# Patient Record
Sex: Female | Born: 1965
Health system: Southern US, Community
[De-identification: ages and names within clinical notes are randomized; demographics above are authoritative.]

## PROBLEM LIST (undated history)

## (undated) DIAGNOSIS — I10 Essential (primary) hypertension: Secondary | ICD-10-CM

## (undated) HISTORY — DX: Essential (primary) hypertension: I10

---

## 2001-04-07 ENCOUNTER — Inpatient Hospital Stay (HOSPITAL_COMMUNITY): Admission: AD | Admit: 2001-04-07 | Discharge: 2001-04-07 | Payer: Self-pay | Admitting: *Deleted

## 2001-05-11 ENCOUNTER — Encounter: Admission: RE | Admit: 2001-05-11 | Discharge: 2001-05-11 | Payer: Self-pay | Admitting: *Deleted

## 2001-05-23 ENCOUNTER — Ambulatory Visit (HOSPITAL_COMMUNITY): Admission: RE | Admit: 2001-05-23 | Discharge: 2001-05-23 | Payer: Self-pay | Admitting: *Deleted

## 2001-05-31 ENCOUNTER — Encounter: Admission: RE | Admit: 2001-05-31 | Discharge: 2001-05-31 | Payer: Self-pay | Admitting: *Deleted

## 2001-06-21 ENCOUNTER — Encounter: Admission: RE | Admit: 2001-06-21 | Discharge: 2001-06-21 | Payer: Self-pay | Admitting: *Deleted

## 2001-06-26 ENCOUNTER — Inpatient Hospital Stay (HOSPITAL_COMMUNITY): Admission: AD | Admit: 2001-06-26 | Discharge: 2001-06-26 | Payer: Self-pay | Admitting: *Deleted

## 2001-07-12 ENCOUNTER — Encounter: Admission: RE | Admit: 2001-07-12 | Discharge: 2001-07-12 | Payer: Self-pay | Admitting: *Deleted

## 2001-08-02 ENCOUNTER — Encounter: Admission: RE | Admit: 2001-08-02 | Discharge: 2001-08-02 | Payer: Self-pay | Admitting: *Deleted

## 2001-08-16 ENCOUNTER — Encounter: Admission: RE | Admit: 2001-08-16 | Discharge: 2001-08-16 | Payer: Self-pay | Admitting: *Deleted

## 2001-08-28 ENCOUNTER — Ambulatory Visit (HOSPITAL_COMMUNITY): Admission: RE | Admit: 2001-08-28 | Discharge: 2001-08-28 | Payer: Self-pay | Admitting: *Deleted

## 2001-08-30 ENCOUNTER — Encounter: Admission: RE | Admit: 2001-08-30 | Discharge: 2001-08-30 | Payer: Self-pay | Admitting: *Deleted

## 2001-09-13 ENCOUNTER — Encounter: Admission: RE | Admit: 2001-09-13 | Discharge: 2001-09-13 | Payer: Self-pay | Admitting: *Deleted

## 2001-09-20 ENCOUNTER — Encounter: Admission: RE | Admit: 2001-09-20 | Discharge: 2001-09-20 | Payer: Self-pay | Admitting: *Deleted

## 2001-09-22 ENCOUNTER — Ambulatory Visit (HOSPITAL_COMMUNITY): Admission: RE | Admit: 2001-09-22 | Discharge: 2001-09-22 | Payer: Self-pay | Admitting: *Deleted

## 2001-09-27 ENCOUNTER — Encounter: Admission: RE | Admit: 2001-09-27 | Discharge: 2001-09-27 | Payer: Self-pay | Admitting: *Deleted

## 2001-09-27 ENCOUNTER — Encounter (HOSPITAL_COMMUNITY): Admission: RE | Admit: 2001-09-27 | Discharge: 2001-10-18 | Payer: Self-pay | Admitting: *Deleted

## 2001-10-04 ENCOUNTER — Encounter: Admission: RE | Admit: 2001-10-04 | Discharge: 2001-10-04 | Payer: Self-pay | Admitting: *Deleted

## 2001-10-11 ENCOUNTER — Encounter: Admission: RE | Admit: 2001-10-11 | Discharge: 2001-10-11 | Payer: Self-pay | Admitting: *Deleted

## 2001-10-18 ENCOUNTER — Encounter: Admission: RE | Admit: 2001-10-18 | Discharge: 2001-10-18 | Payer: Self-pay | Admitting: *Deleted

## 2001-10-19 ENCOUNTER — Inpatient Hospital Stay (HOSPITAL_COMMUNITY): Admission: AD | Admit: 2001-10-19 | Discharge: 2001-10-22 | Payer: Self-pay | Admitting: *Deleted

## 2001-10-20 ENCOUNTER — Encounter (INDEPENDENT_AMBULATORY_CARE_PROVIDER_SITE_OTHER): Payer: Self-pay | Admitting: Specialist

## 2002-05-30 ENCOUNTER — Encounter: Payer: Self-pay | Admitting: Obstetrics & Gynecology

## 2002-05-30 ENCOUNTER — Ambulatory Visit (HOSPITAL_COMMUNITY): Admission: RE | Admit: 2002-05-30 | Discharge: 2002-05-30 | Payer: Self-pay | Admitting: Obstetrics & Gynecology

## 2002-06-22 ENCOUNTER — Encounter: Payer: Self-pay | Admitting: *Deleted

## 2002-06-22 ENCOUNTER — Ambulatory Visit (HOSPITAL_COMMUNITY): Admission: RE | Admit: 2002-06-22 | Discharge: 2002-06-22 | Payer: Self-pay | Admitting: *Deleted

## 2002-11-05 ENCOUNTER — Inpatient Hospital Stay (HOSPITAL_COMMUNITY): Admission: AD | Admit: 2002-11-05 | Discharge: 2002-11-08 | Payer: Self-pay | Admitting: *Deleted

## 2003-06-23 ENCOUNTER — Emergency Department (HOSPITAL_COMMUNITY): Admission: EM | Admit: 2003-06-23 | Discharge: 2003-06-23 | Payer: Self-pay | Admitting: Family Medicine

## 2003-09-20 ENCOUNTER — Ambulatory Visit: Payer: Self-pay | Admitting: Family Medicine

## 2003-12-10 ENCOUNTER — Ambulatory Visit: Payer: Self-pay | Admitting: Family Medicine

## 2003-12-10 DIAGNOSIS — F329 Major depressive disorder, single episode, unspecified: Secondary | ICD-10-CM

## 2004-04-23 ENCOUNTER — Inpatient Hospital Stay (HOSPITAL_COMMUNITY): Admission: AD | Admit: 2004-04-23 | Discharge: 2004-04-23 | Payer: Self-pay | Admitting: *Deleted

## 2004-06-15 ENCOUNTER — Ambulatory Visit (HOSPITAL_COMMUNITY): Admission: RE | Admit: 2004-06-15 | Discharge: 2004-06-15 | Payer: Self-pay | Admitting: *Deleted

## 2004-10-22 ENCOUNTER — Ambulatory Visit: Payer: Self-pay | Admitting: Obstetrics and Gynecology

## 2004-10-22 ENCOUNTER — Inpatient Hospital Stay (HOSPITAL_COMMUNITY): Admission: AD | Admit: 2004-10-22 | Discharge: 2004-10-24 | Payer: Self-pay | Admitting: *Deleted

## 2006-01-22 ENCOUNTER — Emergency Department (HOSPITAL_COMMUNITY): Admission: EM | Admit: 2006-01-22 | Discharge: 2006-01-22 | Payer: Self-pay | Admitting: Emergency Medicine

## 2006-11-18 ENCOUNTER — Telehealth (INDEPENDENT_AMBULATORY_CARE_PROVIDER_SITE_OTHER): Payer: Self-pay | Admitting: *Deleted

## 2006-12-08 ENCOUNTER — Encounter (INDEPENDENT_AMBULATORY_CARE_PROVIDER_SITE_OTHER): Payer: Self-pay | Admitting: *Deleted

## 2007-12-11 ENCOUNTER — Ambulatory Visit: Payer: Self-pay | Admitting: Family Medicine

## 2007-12-11 DIAGNOSIS — G44209 Tension-type headache, unspecified, not intractable: Secondary | ICD-10-CM

## 2008-01-05 DIAGNOSIS — I10 Essential (primary) hypertension: Secondary | ICD-10-CM

## 2008-01-05 HISTORY — DX: Essential (primary) hypertension: I10

## 2008-01-23 ENCOUNTER — Encounter (INDEPENDENT_AMBULATORY_CARE_PROVIDER_SITE_OTHER): Payer: Self-pay | Admitting: Family Medicine

## 2008-01-23 ENCOUNTER — Ambulatory Visit: Payer: Self-pay | Admitting: Family Medicine

## 2008-01-23 LAB — CONVERTED CEMR LAB
Bilirubin Urine: NEGATIVE
Blood in Urine, dipstick: NEGATIVE

## 2008-01-24 ENCOUNTER — Encounter (INDEPENDENT_AMBULATORY_CARE_PROVIDER_SITE_OTHER): Payer: Self-pay | Admitting: Family Medicine

## 2008-01-24 LAB — CONVERTED CEMR LAB
ALT: 15 units/L (ref 0–35)
Alkaline Phosphatase: 61 units/L (ref 39–117)
Basophils Relative: 0 % (ref 0–1)
CO2: 22 meq/L (ref 19–32)
Chlamydia, DNA Probe: NEGATIVE
Creatinine, Ser: 0.94 mg/dL (ref 0.40–1.20)
GC Probe Amp, Genital: NEGATIVE
Glucose, Bld: 79 mg/dL (ref 70–99)
HCT: 41.7 % (ref 36.0–46.0)
Hemoglobin: 14.5 g/dL (ref 12.0–15.0)
Lymphocytes Relative: 44 % (ref 12–46)
Lymphs Abs: 2.8 10*3/uL (ref 0.7–4.0)
MCV: 84.9 fL (ref 78.0–100.0)
Monocytes Absolute: 0.4 10*3/uL (ref 0.1–1.0)
Monocytes Relative: 6 % (ref 3–12)
Potassium: 3.7 meq/L (ref 3.5–5.3)
Sodium: 140 meq/L (ref 135–145)
Total Protein: 8.1 g/dL (ref 6.0–8.3)
WBC: 6.4 10*3/uL (ref 4.0–10.5)

## 2008-01-25 ENCOUNTER — Encounter (INDEPENDENT_AMBULATORY_CARE_PROVIDER_SITE_OTHER): Payer: Self-pay | Admitting: Family Medicine

## 2008-01-25 ENCOUNTER — Ambulatory Visit (HOSPITAL_COMMUNITY): Admission: RE | Admit: 2008-01-25 | Discharge: 2008-01-25 | Payer: Self-pay | Admitting: Family Medicine

## 2008-01-30 ENCOUNTER — Encounter: Admission: RE | Admit: 2008-01-30 | Discharge: 2008-01-30 | Payer: Self-pay | Admitting: Family Medicine

## 2008-02-01 ENCOUNTER — Encounter (INDEPENDENT_AMBULATORY_CARE_PROVIDER_SITE_OTHER): Payer: Self-pay | Admitting: Family Medicine

## 2008-02-27 ENCOUNTER — Encounter (INDEPENDENT_AMBULATORY_CARE_PROVIDER_SITE_OTHER): Payer: Self-pay | Admitting: Family Medicine

## 2008-12-18 ENCOUNTER — Ambulatory Visit: Payer: Self-pay | Admitting: Family Medicine

## 2008-12-18 DIAGNOSIS — R519 Headache, unspecified: Secondary | ICD-10-CM | POA: Insufficient documentation

## 2008-12-18 DIAGNOSIS — J0100 Acute maxillary sinusitis, unspecified: Secondary | ICD-10-CM

## 2008-12-18 DIAGNOSIS — R51 Headache: Secondary | ICD-10-CM

## 2009-03-03 ENCOUNTER — Ambulatory Visit: Payer: Self-pay | Admitting: Nurse Practitioner

## 2009-03-03 DIAGNOSIS — K047 Periapical abscess without sinus: Secondary | ICD-10-CM

## 2009-03-05 ENCOUNTER — Encounter (INDEPENDENT_AMBULATORY_CARE_PROVIDER_SITE_OTHER): Payer: Self-pay | Admitting: Nurse Practitioner

## 2009-09-09 ENCOUNTER — Telehealth (INDEPENDENT_AMBULATORY_CARE_PROVIDER_SITE_OTHER): Payer: Self-pay | Admitting: Nurse Practitioner

## 2009-09-16 ENCOUNTER — Encounter (INDEPENDENT_AMBULATORY_CARE_PROVIDER_SITE_OTHER): Payer: Self-pay | Admitting: Nurse Practitioner

## 2010-02-03 NOTE — Letter (Signed)
Summary: DENTAL REFERRAL  DENTAL REFERRAL   Imported By: Arta Bruce 05/01/2009 10:52:08  _____________________________________________________________________  External Attachment:    Type:   Image     Comment:   External Document

## 2010-02-03 NOTE — Progress Notes (Signed)
Summary: COLD AND COUGH  Phone Note Call from Patient Call back at Home Phone [phone]   Summary of Call: Cheryl Browning old patient. MS Labell CALLED AND SAYS THAT SHE HAS A COLD AND COUGH AND WANTS TO BE SEEN. I OFFEREED HER AN APPOINTMENT WITH DR MULBERRY THIS FRIDAY MORNING AND SHE YELLED AND SAID BY THEN SHE WILL BE BETTER. I TOLD HER TERE WASNT ANYTHING SOONER. Initial call taken by: Leodis Rains,  September 09, 2009 10:43 AM  Follow-up for Phone Call        Called pt. and spoke to husband -- pt. is sleeping at present, wants an appt.  Left message for pt. to call back when awake.  Dutch Quint RN  September 09, 2009 11:57 AM   Additional Follow-up for Phone Call Additional follow up Details #1::        schedule pt an appt with whomever has an opening this week Additional Follow-up by: Lehman Prom FNP,  September 09, 2009 12:48 PM    Additional Follow-up for Phone Call Additional follow up Details #2::    Left message on answering machine for pt. to return call.  Dutch Quint RN  September 09, 2009 2:52 PM  Left message on answering machine for pt. to return call.  Dutch Quint RN  September 10, 2009 12:16 PM  Left message on answering machine for pt. to return call.  Dutch Quint RN  September 15, 2009 10:27 AM  Letter sent.  Dutch Quint RN  September 16, 2009 5:02 PM

## 2010-02-03 NOTE — Letter (Signed)
Summary: Generic Letter  [facility]  [address] [place], [place] [address]   Phone: [phone]  Fax: [phone]       09/16/2009  Cheryl Browning [address] [place], [place]  [address]  Dear [name],  We have been unable to contact you by telephone regarding your recent phone call to our office.  Please call our office, at your earliest convenience, so that we may speak with you.   Sincerely,   Dutch Quint RN

## 2010-02-03 NOTE — Assessment & Plan Note (Signed)
Summary: Dental problems   Vital Signs:  Patient profile:   45 year old female Menstrual status:  regular Height:      62 inches Weight:      151.5 pounds BMI:     27.81 Temp:     97.9 degrees F Pulse rate:   74 / minute Pulse rhythm:   regular Resp:     20 per minute BP sitting:   119 / 78  (left arm) Cuff size:   regular CC: Toothache Is Patient Diabetic? No Pain Assessment Patient in pain? yes     Location: tooth Intensity: 10 Type: sharp Onset of pain  2 days  Does patient need assistance? Functional Status Self care Ambulation Normal Comments pt has mirena iud, lst period 4 yrs ago LMP - Character: IUD Menarche (age onset years): 12-13    Menstrual Status regular Last PAP Result NEGATIVE FOR INTRAEPITHELIAL LESIONS OR MALIGNANCY.   Primary Care Provider:  Beverley Fiedler MD  CC:  Toothache.  History of Present Illness:  Pt into the office with complaints of left upper tooth pain intermittent pain in tooth previously but current pain started 3 days ago and continues. Some swelling of jaw Decreased appetite due to discomfort sensitivity to hot and cold foods +headache -fever (that was measured) but did have some chills Pt has not taken anything for pain. Previous Rx for Naprosyn but did not get filed  Pt has never been to the dentist.  No previous dental extractions. Wisdom teeth are all in place.  Allergies (verified): 1)  ! * Chloquen  Review of Systems General:  Complains of chills and loss of appetite; denies fever; mouth pain - upper tooth pain. CV:  Denies chest pain or discomfort. Resp:  Denies cough. GI:  Denies abdominal pain, nausea, and vomiting.  Physical Exam  General:  alert.  sickly  Head:  normocephalic.   left jaw swelling Mouth:  wisdom teeth in place left upper tooth - appears to be impacted by wisdom tooth general discoloration to all teeth   Impression & Recommendations:  Problem # 1:  ABSCESS, TOOTH  (ICD-522.5) impacted left upper wisdom tooth with abcess pt advised of Dx  antibiotics ordered Orders: Dental Referral (Dentist)  Complete Medication List: 1)  Naprosyn 500 Mg Tabs (Naproxen) .... One tablet by mouth two times a day for pain as needed 2)  Flonase 50 Mcg/act Susp (Fluticasone propionate) .... 2 puffs per nostril daily 3)  Penicillin V Potassium 500 Mg Tabs (Penicillin v potassium) .... One tablet by mouth three times a day for infection  Patient Instructions: 1)  You will be referred to the dental clinic. they will contact you with a time/date of the appointment 2)  Take all the antibiotics as ordered 3)  follow up here as needed Prescriptions: NAPROSYN 500 MG TABS (NAPROXEN) One tablet by mouth two times a day for pain as needed  #60 x 0   Entered and Authorized by:   Lehman Prom FNP   Signed by:   Lehman Prom FNP on 03/03/2009   Method used:   Print then Give to Patient   RxID:   [id number] PENICILLIN V POTASSIUM 500 MG TABS (PENICILLIN V POTASSIUM) One tablet by mouth three times a day for infection  #30 x 0   Entered and Authorized by:   Lehman Prom FNP   Signed by:   Lehman Prom FNP on 03/03/2009   Method used:   Print then Give to Patient   RxID:  [id number]  

## 2010-07-01 ENCOUNTER — Emergency Department (HOSPITAL_COMMUNITY): Payer: No Typology Code available for payment source

## 2010-07-01 ENCOUNTER — Emergency Department (HOSPITAL_COMMUNITY)
Admission: EM | Admit: 2010-07-01 | Discharge: 2010-07-01 | Disposition: A | Discharge status: Home / Self Care | Payer: No Typology Code available for payment source | Attending: Emergency Medicine | Admitting: Emergency Medicine

## 2010-07-01 DIAGNOSIS — S139XXA Sprain of joints and ligaments of unspecified parts of neck, initial encounter: Secondary | ICD-10-CM | POA: Insufficient documentation

## 2010-07-01 DIAGNOSIS — M542 Cervicalgia: Secondary | ICD-10-CM | POA: Insufficient documentation

## 2010-07-01 DIAGNOSIS — M549 Dorsalgia, unspecified: Secondary | ICD-10-CM | POA: Insufficient documentation

## 2013-11-09 ENCOUNTER — Other Ambulatory Visit: Payer: Self-pay | Admitting: General Practice

## 2013-11-09 DIAGNOSIS — R519 Headache, unspecified: Secondary | ICD-10-CM

## 2013-11-09 DIAGNOSIS — R51 Headache: Principal | ICD-10-CM

## 2013-11-09 DIAGNOSIS — Z1231 Encounter for screening mammogram for malignant neoplasm of breast: Secondary | ICD-10-CM

## 2013-12-07 ENCOUNTER — Ambulatory Visit: Payer: Medicaid Other

## 2013-12-07 ENCOUNTER — Other Ambulatory Visit: Payer: Medicaid Other

## 2014-06-15 ENCOUNTER — Emergency Department (INDEPENDENT_AMBULATORY_CARE_PROVIDER_SITE_OTHER)
Admission: EM | Admit: 2014-06-15 | Discharge: 2014-06-15 | Disposition: A | Discharge status: Home / Self Care | Payer: Self-pay | Source: Home / Self Care | Attending: Family Medicine | Admitting: Family Medicine

## 2014-06-15 ENCOUNTER — Encounter (HOSPITAL_COMMUNITY): Payer: Self-pay | Admitting: Emergency Medicine

## 2014-06-15 DIAGNOSIS — S338XXA Sprain of other parts of lumbar spine and pelvis, initial encounter: Secondary | ICD-10-CM

## 2014-06-15 DIAGNOSIS — S39012A Strain of muscle, fascia and tendon of lower back, initial encounter: Secondary | ICD-10-CM

## 2014-06-15 DIAGNOSIS — S161XXA Strain of muscle, fascia and tendon at neck level, initial encounter: Secondary | ICD-10-CM

## 2014-06-15 DIAGNOSIS — S40021A Contusion of right upper arm, initial encounter: Secondary | ICD-10-CM

## 2014-06-15 MED ORDER — CYCLOBENZAPRINE HCL 10 MG PO TABS: 10.0000 mg | ORAL_TABLET | Freq: Every evening | ORAL | Status: DC | PRN

## 2014-06-15 MED ORDER — NAPROXEN 375 MG PO TABS: 375.0000 mg | ORAL_TABLET | Freq: Two times a day (BID) | ORAL | Status: DC

## 2014-06-15 NOTE — Discharge Instructions (Signed)
Thank you for coming in today. Come back or go to the emergency room if you notice new weakness new numbness problems walking or bowel or bladder problems.  Cervical Strain and Sprain (Whiplash) with Rehab Cervical strain and sprain are injuries that commonly occur with "whiplash" injuries. Whiplash occurs when the neck is forcefully whipped backward or forward, such as during a motor vehicle accident or during contact sports. The muscles, ligaments, tendons, discs, and nerves of the neck are susceptible to injury when this occurs. RISK FACTORS Risk of having a whiplash injury increases if:  Osteoarthritis of the spine.  Situations that make head or neck accidents or trauma more likely.  High-risk sports (football, rugby, wrestling, hockey, auto racing, gymnastics, diving, contact karate, or boxing).  Poor strength and flexibility of the neck.  Previous neck injury.  Poor tackling technique.  Improperly fitted or padded equipment. SYMPTOMS   Pain or stiffness in the front or back of neck or both.  Symptoms may present immediately or up to 24 hours after injury.  Dizziness, headache, nausea, and vomiting.  Muscle spasm with soreness and stiffness in the neck.  Tenderness and swelling at the injury site. PREVENTION  Learn and use proper technique (avoid tackling with the head, spearing, and head-butting; use proper falling techniques to avoid landing on the head).  Warm up and stretch properly before activity.  Maintain physical fitness:  Strength, flexibility, and endurance.  Cardiovascular fitness.  Wear properly fitted and padded protective equipment, such as padded soft collars, for participation in contact sports. PROGNOSIS  Recovery from cervical strain and sprain injuries is dependent on the extent of the injury. These injuries are usually curable in 1 week to 3 months with appropriate treatment.  RELATED COMPLICATIONS   Temporary numbness and weakness may occur  if the nerve roots are damaged, and this may persist until the nerve has completely healed.  Chronic pain due to frequent recurrence of symptoms.  Prolonged healing, especially if activity is resumed too soon (before complete recovery). TREATMENT  Treatment initially involves the use of ice and medication to help reduce pain and inflammation. It is also important to perform strengthening and stretching exercises and modify activities that worsen symptoms so the injury does not get worse. These exercises may be performed at home or with a therapist. For patients who experience severe symptoms, a soft, padded collar may be recommended to be worn around the neck.  Improving your posture may help reduce symptoms. Posture improvement includes pulling your chin and abdomen in while sitting or standing. If you are sitting, sit in a firm chair with your buttocks against the back of the chair. While sleeping, try replacing your pillow with a small towel rolled to 2 inches in diameter, or use a cervical pillow or soft cervical collar. Poor sleeping positions delay healing.  For patients with nerve root damage, which causes numbness or weakness, the use of a cervical traction apparatus may be recommended. Surgery is rarely necessary for these injuries. However, cervical strain and sprains that are present at birth (congenital) may require surgery. MEDICATION   If pain medication is necessary, nonsteroidal anti-inflammatory medications, such as aspirin and ibuprofen, or other minor pain relievers, such as acetaminophen, are often recommended.  Do not take pain medication for 7 days before surgery.  Prescription pain relievers may be given if deemed necessary by your caregiver. Use only as directed and only as much as you need. HEAT AND COLD:   Cold treatment (icing) relieves pain  and reduces inflammation. Cold treatment should be applied for 10 to 15 minutes every 2 to 3 hours for inflammation and pain and  immediately after any activity that aggravates your symptoms. Use ice packs or an ice massage.  Heat treatment may be used prior to performing the stretching and strengthening activities prescribed by your caregiver, physical therapist, or athletic trainer. Use a heat pack or a warm soak. SEEK MEDICAL CARE IF:   Symptoms get worse or do not improve in 2 weeks despite treatment.  New, unexplained symptoms develop (drugs used in treatment may produce side effects). EXERCISES RANGE OF MOTION (ROM) AND STRETCHING EXERCISES - Cervical Strain and Sprain These exercises may help you when beginning to rehabilitate your injury. In order to successfully resolve your symptoms, you must improve your posture. These exercises are designed to help reduce the forward-head and rounded-shoulder posture which contributes to this condition. Your symptoms may resolve with or without further involvement from your physician, physical therapist or athletic trainer. While completing these exercises, remember:   Restoring tissue flexibility helps normal motion to return to the joints. This allows healthier, less painful movement and activity.  An effective stretch should be held for at least 20 seconds, although you may need to begin with shorter hold times for comfort.  A stretch should never be painful. You should only feel a gentle lengthening or release in the stretched tissue. STRETCH- Axial Extensors  Lie on your back on the floor. You may bend your knees for comfort. Place a rolled-up hand towel or dish towel, about 2 inches in diameter, under the part of your head that makes contact with the floor.  Gently tuck your chin, as if trying to make a "double chin," until you feel a gentle stretch at the base of your head.  Hold __________ seconds. Repeat __________ times. Complete this exercise __________ times per day.  STRETCH - Axial Extension   Stand or sit on a firm surface. Assume a good posture: chest up,  shoulders drawn back, abdominal muscles slightly tense, knees unlocked (if standing) and feet hip width apart.  Slowly retract your chin so your head slides back and your chin slightly lowers. Continue to look straight ahead.  You should feel a gentle stretch in the back of your head. Be certain not to feel an aggressive stretch since this can cause headaches later.  Hold for __________ seconds. Repeat __________ times. Complete this exercise __________ times per day. STRETCH - Cervical Side Bend   Stand or sit on a firm surface. Assume a good posture: chest up, shoulders drawn back, abdominal muscles slightly tense, knees unlocked (if standing) and feet hip width apart.  Without letting your nose or shoulders move, slowly tip your right / left ear to your shoulder until your feel a gentle stretch in the muscles on the opposite side of your neck.  Hold __________ seconds. Repeat __________ times. Complete this exercise __________ times per day. STRETCH - Cervical Rotators   Stand or sit on a firm surface. Assume a good posture: chest up, shoulders drawn back, abdominal muscles slightly tense, knees unlocked (if standing) and feet hip width apart.  Keeping your eyes level with the ground, slowly turn your head until you feel a gentle stretch along the back and opposite side of your neck.  Hold __________ seconds. Repeat __________ times. Complete this exercise __________ times per day. RANGE OF MOTION - Neck Circles   Stand or sit on a firm surface. Assume a good  posture: chest up, shoulders drawn back, abdominal muscles slightly tense, knees unlocked (if standing) and feet hip width apart.  Gently roll your head down and around from the back of one shoulder to the back of the other. The motion should never be forced or painful.  Repeat the motion 10-20 times, or until you feel the neck muscles relax and loosen. Repeat __________ times. Complete the exercise __________ times per  day. STRENGTHENING EXERCISES - Cervical Strain and Sprain These exercises may help you when beginning to rehabilitate your injury. They may resolve your symptoms with or without further involvement from your physician, physical therapist, or athletic trainer. While completing these exercises, remember:   Muscles can gain both the endurance and the strength needed for everyday activities through controlled exercises.  Complete these exercises as instructed by your physician, physical therapist, or athletic trainer. Progress the resistance and repetitions only as guided.  You may experience muscle soreness or fatigue, but the pain or discomfort you are trying to eliminate should never worsen during these exercises. If this pain does worsen, stop and make certain you are following the directions exactly. If the pain is still present after adjustments, discontinue the exercise until you can discuss the trouble with your clinician. STRENGTH - Cervical Flexors, Isometric  Face a wall, standing about 6 inches away. Place a small pillow, a ball about 6-8 inches in diameter, or a folded towel between your forehead and the wall.  Slightly tuck your chin and gently push your forehead into the soft object. Push only with mild to moderate intensity, building up tension gradually. Keep your jaw and forehead relaxed.  Hold 10 to 20 seconds. Keep your breathing relaxed.  Release the tension slowly. Relax your neck muscles completely before you start the next repetition. Repeat __________ times. Complete this exercise __________ times per day. STRENGTH- Cervical Lateral Flexors, Isometric   Stand about 6 inches away from a wall. Place a small pillow, a ball about 6-8 inches in diameter, or a folded towel between the side of your head and the wall.  Slightly tuck your chin and gently tilt your head into the soft object. Push only with mild to moderate intensity, building up tension gradually. Keep your jaw and  forehead relaxed.  Hold 10 to 20 seconds. Keep your breathing relaxed.  Release the tension slowly. Relax your neck muscles completely before you start the next repetition. Repeat __________ times. Complete this exercise __________ times per day. STRENGTH - Cervical Extensors, Isometric   Stand about 6 inches away from a wall. Place a small pillow, a ball about 6-8 inches in diameter, or a folded towel between the back of your head and the wall.  Slightly tuck your chin and gently tilt your head back into the soft object. Push only with mild to moderate intensity, building up tension gradually. Keep your jaw and forehead relaxed.  Hold 10 to 20 seconds. Keep your breathing relaxed.  Release the tension slowly. Relax your neck muscles completely before you start the next repetition. Repeat __________ times. Complete this exercise __________ times per day. POSTURE AND BODY MECHANICS CONSIDERATIONS - Cervical Strain and Sprain Keeping correct posture when sitting, standing or completing your activities will reduce the stress put on different body tissues, allowing injured tissues a chance to heal and limiting painful experiences. The following are general guidelines for improved posture. Your physician or physical therapist will provide you with any instructions specific to your needs. While reading these guidelines, remember:  The  exercises prescribed by your provider will help you have the flexibility and strength to maintain correct postures.  The correct posture provides the optimal environment for your joints to work. All of your joints have less wear and tear when properly supported by a spine with good posture. This means you will experience a healthier, less painful body.  Correct posture must be practiced with all of your activities, especially prolonged sitting and standing. Correct posture is as important when doing repetitive low-stress activities (typing) as it is when doing a single  heavy-load activity (lifting). PROLONGED STANDING WHILE SLIGHTLY LEANING FORWARD When completing a task that requires you to lean forward while standing in one place for a long time, place either foot up on a stationary 2- to 4-inch high object to help maintain the best posture. When both feet are on the ground, the low back tends to lose its slight inward curve. If this curve flattens (or becomes too large), then the back and your other joints will experience too much stress, fatigue more quickly, and can cause pain.  RESTING POSITIONS Consider which positions are most painful for you when choosing a resting position. If you have pain with flexion-based activities (sitting, bending, stooping, squatting), choose a position that allows you to rest in a less flexed posture. You would want to avoid curling into a fetal position on your side. If your pain worsens with extension-based activities (prolonged standing, working overhead), avoid resting in an extended position such as sleeping on your stomach. Most people will find more comfort when they rest with their spine in a more neutral position, neither too rounded nor too arched. Lying on a non-sagging bed on your side with a pillow between your knees, or on your back with a pillow under your knees will often provide some relief. Keep in mind, being in any one position for a prolonged period of time, no matter how correct your posture, can still lead to stiffness. WALKING Walk with an upright posture. Your ears, shoulders, and hips should all line up. OFFICE WORK When working at a desk, create an environment that supports good, upright posture. Without extra support, muscles fatigue and lead to excessive strain on joints and other tissues. CHAIR:  A chair should be able to slide under your desk when your back makes contact with the back of the chair. This allows you to work closely.  The chair's height should allow your eyes to be level with the upper  part of your monitor and your hands to be slightly lower than your elbows.  Body position:  Your feet should make contact with the floor. If this is not possible, use a foot rest.  Keep your ears over your shoulders. This will reduce stress on your neck and low back. Document Released: 12/21/2004 Document Revised: 05/07/2013 Document Reviewed: 04/04/2008 [facility] Patient Information 2015 [place], [place]. This information is not intended to replace advice given to you by your health care provider. Make sure you discuss any questions you have with your health care provider.   Lumbosacral Strain Lumbosacral strain is a strain of any of the parts that make up your lumbosacral vertebrae. Your lumbosacral vertebrae are the bones that make up the lower third of your backbone. Your lumbosacral vertebrae are held together by muscles and tough, fibrous tissue (ligaments).  CAUSES  A sudden blow to your back can cause lumbosacral strain. Also, anything that causes an excessive stretch of the muscles in the low back can cause this strain.  This is typically seen when people exert themselves strenuously, fall, lift heavy objects, bend, or crouch repeatedly. RISK FACTORS  Physically demanding work.  Participation in pushing or pulling sports or sports that require a sudden twist of the back (tennis, golf, baseball).  Weight lifting.  Excessive lower back curvature.  Forward-tilted pelvis.  Weak back or abdominal muscles or both.  Tight hamstrings. SIGNS AND SYMPTOMS  Lumbosacral strain may cause pain in the area of your injury or pain that moves (radiates) down your leg.  DIAGNOSIS Your health care provider can often diagnose lumbosacral strain through a physical exam. In some cases, you may need tests such as X-ray exams.  TREATMENT  Treatment for your lower back injury depends on many factors that your clinician will have to evaluate. However, most treatment will include the use of  anti-inflammatory medicines. HOME CARE INSTRUCTIONS   Avoid hard physical activities (tennis, racquetball, waterskiing) if you are not in proper physical condition for it. This may aggravate or create problems.  If you have a back problem, avoid sports requiring sudden body movements. Swimming and walking are generally safer activities.  Maintain good posture.  Maintain a healthy weight.  For acute conditions, you may put ice on the injured area.  Put ice in a plastic bag.  Place a towel between your skin and the bag.  Leave the ice on for 20 minutes, 2-3 times a day.  When the low back starts healing, stretching and strengthening exercises may be recommended. SEEK MEDICAL CARE IF:  Your back pain is getting worse.  You experience severe back pain not relieved with medicines. SEEK IMMEDIATE MEDICAL CARE IF:   You have numbness, tingling, weakness, or problems with the use of your arms or legs.  There is a change in bowel or bladder control.  You have increasing pain in any area of the body, including your belly (abdomen).  You notice shortness of breath, dizziness, or feel faint.  You feel sick to your stomach (nauseous), are throwing up (vomiting), or become sweaty.  You notice discoloration of your toes or legs, or your feet get very cold. MAKE SURE YOU:   Understand these instructions.  Will watch your condition.  Will get help right away if you are not doing well or get worse. Document Released: 09/30/2004 Document Revised: 12/26/2012 Document Reviewed: 08/09/2012 [facility] Patient Information 2015 [place], [place]. This information is not intended to replace advice given to you by your health care provider. Make sure you discuss any questions you have with your health care provider.   Contusion A contusion is a deep bruise. Contusions happen when an injury causes bleeding under the skin. Signs of bruising include pain, puffiness (swelling), and discolored skin.  The contusion may turn blue, purple, or yellow. HOME CARE   Put ice on the injured area.  Put ice in a plastic bag.  Place a towel between your skin and the bag.  Leave the ice on for 15-20 minutes, 03-04 times a day.  Only take medicine as told by your doctor.  Rest the injured area.  If possible, raise (elevate) the injured area to lessen puffiness. GET HELP RIGHT AWAY IF:   You have more bruising or puffiness.  You have pain that is getting worse.  Your puffiness or pain is not helped by medicine. MAKE SURE YOU:   Understand these instructions.  Will watch your condition.  Will get help right away if you are not doing well or get worse. Document Released:  06/09/2007 Document Revised: 03/15/2011 Document Reviewed: 10/26/2010 [employer] Patient Information 2015 [place], [place]. This information is not intended to replace advice given to you by your health care provider. Make sure you discuss any questions you have with your health care provider.
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
## 2017-10-24 ENCOUNTER — Encounter: Payer: Self-pay | Admitting: Internal Medicine

## 2017-10-24 ENCOUNTER — Ambulatory Visit: Payer: Self-pay | Admitting: Internal Medicine

## 2017-10-24 VITALS — BP 134/98 | HR 66 | Resp 12 | Ht 62.0 in | Wt 159.0 lb

## 2017-10-24 DIAGNOSIS — Z79899 Other long term (current) drug therapy: Secondary | ICD-10-CM

## 2017-10-24 DIAGNOSIS — I10 Essential (primary) hypertension: Secondary | ICD-10-CM

## 2017-10-24 DIAGNOSIS — M2550 Pain in unspecified joint: Secondary | ICD-10-CM

## 2017-10-24 MED ORDER — LOSARTAN POTASSIUM 50 MG PO TABS: 50.0000 mg | ORAL_TABLET | Freq: Every day | ORAL | 3 refills | Status: DC

## 2017-10-24 MED ORDER — AMLODIPINE BESYLATE 2.5 MG PO TABS: 2.5000 mg | ORAL_TABLET | Freq: Every day | ORAL | 11 refills | Status: DC

## 2017-10-24 MED ORDER — HYDROCHLOROTHIAZIDE 25 MG PO TABS: 25.0000 mg | ORAL_TABLET | Freq: Every day | ORAL | 3 refills | Status: DC

## 2017-10-24 NOTE — Progress Notes (Signed)
Subjective:    Patient ID: Cheryl Browning, female    DOB: 29-Nov-1965, 52 y.o.   MRN: [id number]  HPI   Here to establish  1.  Hypertension: diagnosed 2-3 years ago.   Has been on Losartan and HCTZ combination for about 1 year.  Has been taking meds without miss for 2 months.c She does not believe this combination has controlled her bp well. History of ACE I cough with Lisinopril. She has not had bloodwork in some time  2.  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  Sounds like Naproxen has been working for her in the past for this.  Not clear why she does not use as needed.  Current Meds  Medication Sig  . hydrochlorothiazide (HYDRODIURIL) 25 MG tablet Take 25 mg by mouth daily.  Marland Kitchen losartan (COZAAR) 50 MG tablet Take 50 mg by mouth daily.    Allergies  Allergen Reactions  . Lisinopril Cough    ACE I cough   History reviewed. No pertinent past medical history.   History reviewed. No pertinent surgical history.   Family History  Problem Relation Age of Onset  . Hypertension Father    Social History   Socioeconomic History  . Marital status: Married    Spouse name: Not on file  . Number of children: Not on file  . Years of education: Not on file  . Highest education level: Not on file  Occupational History  . Occupation: Marine scientist at [employer]  . Financial resource strain: Not on file  . Food insecurity:    Worry: Never true    Inability: Never true  . Transportation needs:    Medical: No    Non-medical: No  Tobacco Use  . Smoking status: Never Smoker  . Smokeless tobacco: Never Used  Substance and Sexual Activity  . Alcohol use: Never    Frequency: Never  . Drug use: Never  . Sexual activity: Not on file  Lifestyle  . Physical activity:    Days per week: Not on file    Minutes per session: Not on file  . Stress: Only a little  Relationships  . Social connections:    Talks on phone: Not on file    Gets together: Not on  file    Attends religious service: Not on file    Active member of club or organization: Not on file    Attends meetings of clubs or organizations: Not on file    Relationship status: Not on file  . Intimate partner violence:    Fear of current or ex partner: Not on file    Emotionally abused: No    Physically abused: No    Forced sexual activity: Not on file  Other Topics Concern  . Not on file  Social History Narrative   Lives at home with husband, mother in law and all of her children    Review of Systems     Objective:   Physical Exam NAD HEENT:  PERRL, EOMI, Discs sharp, TMs pearly gray, throat without injection.   Neck:  Supple, No adenopathy, no thyromegaly Chest:   CTA CV:  RRR with normal S1 and S2, No S3, S4 or murmur.  Carotid, radial and DP pulses normal and equal Abd:  S, NT, No HSM or mass, + BS LE:  No edema.       Assessment & Plan:  1.  Hypertension:  BMP.  Continue Losartan  and HCTZ in the morning and add low dose Amlodipine 2.5 mg in the evening.  2.  Joint complaints:  Naproxen twice daily as needed with food.  To return in 2 weeks for BP and pulse and fasting labs.   With me in 3-4 months for CPE without pap.

## 2017-10-24 NOTE — Progress Notes (Signed)
LCSW completed new patient screening with patient in order to assess for mental health concerns and/or problems with social determinants of health (food, housing, transportation, interpersonal violence). Cheryl Browning reported that she has a very low stress level and did not endorse any mental health symptoms. She shared that she does not have any needs with the SDOH. She stated that she is able to cope with stress well because she has a positive outlook on life.

## 2017-10-25 LAB — BASIC METABOLIC PANEL
BUN/Creatinine Ratio: 18 (ref 9–23)
BUN: 16 mg/dL (ref 6–24)
CALCIUM: 9.9 mg/dL (ref 8.7–10.2)
CHLORIDE: 101 mmol/L (ref 96–106)
CO2: 26 mmol/L (ref 20–29)
Creatinine, Ser: 0.89 mg/dL (ref 0.57–1.00)
GFR calc non Af Amer: 76 mL/min/{1.73_m2} (ref >59)
GFR, EST AFRICAN AMERICAN: 87 mL/min/{1.73_m2} (ref >59)
Glucose: 84 mg/dL (ref 65–99)
POTASSIUM: 4 mmol/L (ref 3.5–5.2)
Sodium: 140 mmol/L (ref 134–144)

## 2017-11-08 ENCOUNTER — Other Ambulatory Visit: Payer: Self-pay

## 2017-11-09 ENCOUNTER — Other Ambulatory Visit (INDEPENDENT_AMBULATORY_CARE_PROVIDER_SITE_OTHER): Payer: 59

## 2017-11-09 VITALS — BP 136/82 | HR 68

## 2017-11-09 DIAGNOSIS — Z79899 Other long term (current) drug therapy: Secondary | ICD-10-CM | POA: Diagnosis not present

## 2017-11-09 DIAGNOSIS — I10 Essential (primary) hypertension: Secondary | ICD-10-CM | POA: Diagnosis not present

## 2017-11-09 DIAGNOSIS — Z13220 Encounter for screening for lipoid disorders: Secondary | ICD-10-CM | POA: Diagnosis not present

## 2017-11-10 LAB — COMPREHENSIVE METABOLIC PANEL
A/G RATIO: 1.3 (ref 1.2–2.2)
ALK PHOS: 86 IU/L (ref 39–117)
ALT: 18 IU/L (ref 0–32)
AST: 17 IU/L (ref 0–40)
Albumin: 4.1 g/dL (ref 3.5–5.5)
BILIRUBIN TOTAL: 0.2 mg/dL (ref 0.0–1.2)
BUN/Creatinine Ratio: 12 (ref 9–23)
BUN: 9 mg/dL (ref 6–24)
CHLORIDE: 105 mmol/L (ref 96–106)
CO2: 27 mmol/L (ref 20–29)
Calcium: 9.8 mg/dL (ref 8.7–10.2)
Creatinine, Ser: 0.75 mg/dL (ref 0.57–1.00)
GFR calc Af Amer: 107 mL/min/{1.73_m2} (ref >59)
GFR calc non Af Amer: 93 mL/min/{1.73_m2} (ref >59)
GLUCOSE: 85 mg/dL (ref 65–99)
Globulin, Total: 3.1 g/dL (ref 1.5–4.5)
POTASSIUM: 4.1 mmol/L (ref 3.5–5.2)
SODIUM: 144 mmol/L (ref 134–144)
Total Protein: 7.2 g/dL (ref 6.0–8.5)

## 2017-11-10 LAB — CBC WITH DIFFERENTIAL/PLATELET
BASOS ABS: 0 10*3/uL (ref 0.0–0.2)
Basos: 0 %
EOS (ABSOLUTE): 0 10*3/uL (ref 0.0–0.4)
EOS: 1 %
HEMOGLOBIN: 14.3 g/dL (ref 11.1–15.9)
Hematocrit: 41.4 % (ref 34.0–46.6)
IMMATURE GRANULOCYTES: 0 %
Immature Grans (Abs): 0 10*3/uL (ref 0.0–0.1)
Lymphocytes Absolute: 2 10*3/uL (ref 0.7–3.1)
Lymphs: 45 %
MCH: 29.6 pg (ref 26.6–33.0)
MCHC: 34.5 g/dL (ref 31.5–35.7)
MCV: 86 fL (ref 79–97)
Monocytes Absolute: 0.3 10*3/uL (ref 0.1–0.9)
Monocytes: 7 %
NEUTROS PCT: 47 %
Neutrophils Absolute: 2.1 10*3/uL (ref 1.4–7.0)
Platelets: 141 10*3/uL — ABNORMAL LOW (ref 150–450)
RBC: 4.83 x10E6/uL (ref 3.77–5.28)
RDW: 14 % (ref 12.3–15.4)
WBC: 4.5 10*3/uL (ref 3.4–10.8)

## 2017-11-10 LAB — LIPID PANEL W/O CHOL/HDL RATIO
Cholesterol, Total: 131 mg/dL (ref 100–199)
HDL: 64 mg/dL (ref >39)
LDL Calculated: 55 mg/dL (ref 0–99)
Triglycerides: 58 mg/dL (ref 0–149)
VLDL CHOLESTEROL CAL: 12 mg/dL (ref 5–40)

## 2018-01-27 ENCOUNTER — Encounter: Payer: Self-pay | Admitting: Internal Medicine

## 2018-01-27 ENCOUNTER — Ambulatory Visit: Payer: Commercial Managed Care - PPO | Admitting: Internal Medicine

## 2018-01-27 VITALS — BP 122/82 | HR 68 | Resp 12 | Ht 62.0 in | Wt 170.0 lb

## 2018-01-27 DIAGNOSIS — Z0000 Encounter for general adult medical examination without abnormal findings: Secondary | ICD-10-CM

## 2018-01-27 DIAGNOSIS — B349 Viral infection, unspecified: Secondary | ICD-10-CM

## 2018-01-27 DIAGNOSIS — Z1211 Encounter for screening for malignant neoplasm of colon: Secondary | ICD-10-CM

## 2018-01-27 DIAGNOSIS — N898 Other specified noninflammatory disorders of vagina: Secondary | ICD-10-CM | POA: Diagnosis not present

## 2018-01-27 DIAGNOSIS — Z1239 Encounter for other screening for malignant neoplasm of breast: Secondary | ICD-10-CM | POA: Diagnosis not present

## 2018-01-27 DIAGNOSIS — I10 Essential (primary) hypertension: Secondary | ICD-10-CM | POA: Diagnosis not present

## 2018-01-27 DIAGNOSIS — Z124 Encounter for screening for malignant neoplasm of cervix: Secondary | ICD-10-CM

## 2018-01-27 LAB — POCT WET PREP WITH KOH
KOH Prep POC: NEGATIVE
RBC Wet Prep HPF POC: NEGATIVE
TRICHOMONAS UA: NEGATIVE
YEAST WET PREP PER HPF POC: NEGATIVE

## 2018-01-27 MED ORDER — AMLODIPINE BESYLATE 10 MG PO TABS: ORAL_TABLET | ORAL | 3 refills | Status: DC

## 2018-01-27 NOTE — Progress Notes (Signed)
Subjective:    Patient ID: Cheryl Browning, female   DOB: 1965/12/04, 53 y.o.   MRN: [id number]   HPI   CPE with pap  1.  Pap:  Cannot remember when had last pap--more than 3 years ago.  Always normal.  No family history of cervical cancer.  2.  Mammogram:  Last mammogram, including extra views of left breast ultimately normal in 2010.  Nothing since.  No family history of breast cancer.   3.  Osteoprevention:  Drinks 1 cup 2% milk daily.  Does like almond milk.  She feels she can drink 3 cups daily for Calcium and Vitamin.  Daily physical activity.  Walks or jogs daily.  40 minutes daily to go to [employer].    4.  Guaiac Cards:  Never.   5.  Colonoscopy:  Never.  No family history of colon cancer.  6.  Immunizations:  She believes she has an up to date Td.  Immunization History  Administered Date(s) Administered  . Influenza, Seasonal, Injecte, Preservative Fre 10/09/2016  . Influenza-Unspecified 10/03/2017    7.  Glucose/Cholesterol:  Fasting glucose and FLP in November 2019 at goal.   Lipid Panel     Component Value Date/Time   CHOL 131 11/09/2017 1048   TRIG 58 11/09/2017 1048   HDL 64 11/09/2017 1048   CHOLHDL 2.2 Ratio 01/24/2008 0019   VLDL 9 01/24/2008 0019   LDLCALC 55 11/09/2017 1048    Current Meds  Medication Sig  . amLODipine (NORVASC) 2.5 MG tablet Take 1 tablet (2.5 mg total) by mouth daily.  . hydrochlorothiazide (HYDRODIURIL) 25 MG tablet Take 1 tablet (25 mg total) by mouth daily.  Marland Kitchen losartan (COZAAR) 50 MG tablet Take 1 tablet (50 mg total) by mouth daily.   Allergies  Allergen Reactions  . Lisinopril Cough    ACE I cough   Past Medical History:  Diagnosis Date  . Hypertension 2010    No past surgical history on file.  Social History   Socioeconomic History  . Marital status: Married    Spouse name: Rhylei Mcquaig  . Number of children: 5  . Years of education: Not on file  . Highest education level: Master's degree (e.g., MA,  MS, MEng, MEd, MSW, MBA)  Occupational History  . Occupation: Marine scientist at [employer]  . Financial resource strain: Not on file  . Food insecurity:    Worry: Never true    Inability: Never true  . Transportation needs:    Medical: No    Non-medical: No  Tobacco Use  . Smoking status: Never Smoker  . Smokeless tobacco: Never Used  Substance and Sexual Activity  . Alcohol use: Never    Frequency: Never  . Drug use: Never  . Sexual activity: Yes    Birth control/protection: I.U.D.  Lifestyle  . Physical activity:    Days per week: Not on file    Minutes per session: Not on file  . Stress: Only a little  Relationships  . Social connections:    Talks on phone: Not on file    Gets together: Not on file    Attends religious service: Not on file    Active member of club or organization: Not on file    Attends meetings of clubs or organizations: Not on file    Relationship status: Not on file  . Intimate partner violence:    Fear of current or ex partner: Not on file  Emotionally abused: No    Physically abused: No    Forced sexual activity: Not on file  Other Topics Concern  . Not on file  Social History Narrative   Lives at home with husband, mother in law and all of her children    Family History  Problem Relation Age of Onset  . Hypertension Father     Review of Systems  Constitutional: Negative for appetite change, fatigue and fever.  HENT: Positive for congestion (started last night.  Body aches.  No fever.  No sore throat.  + headache.  She has not been around others with illness, though does work as a Marine scientist in progressive [employer] and at [employer] Negative for dental problem, ear pain and sore throat.   Eyes: Negative for visual disturbance (Uses reading glasses.).  Respiratory: Negative for cough and shortness of breath.   Cardiovascular: Negative for chest pain, palpitations and leg swelling.  Gastrointestinal: Negative for abdominal pain,  blood in stool (No melena), constipation, diarrhea, nausea and vomiting.  Genitourinary: Negative for dysuria, frequency and menstrual problem (No period with IUD.  Is having hot flashes and night sweats.).  Musculoskeletal: Positive for arthralgias (Bilateral knees.  AFter sitting for a while, hard to get up.  ) and myalgias.  Neurological: Positive for headaches (once weekly, wakes with a headache and sbp is 160+.  Does not miss medication). Negative for weakness and numbness.  Psychiatric/Behavioral: Negative for dysphoric mood. The patient is not nervous/anxious.       Objective:   BP 122/82 (BP Location: Left Arm, Patient Position: Sitting, Cuff Size: Normal)   Pulse 68   Resp 12   Ht 5\' 2"  (1.575 m)   Wt 170 lb (77.1 kg)   LMP  (LMP Unknown)   BMI 31.09 kg/m   Physical Exam  Constitutional: She is oriented to person, place, and time. She appears well-developed and well-nourished.  HENT:  Head: Normocephalic and atraumatic.  Right Ear: Hearing, tympanic membrane, external ear and ear canal normal.  Left Ear: Hearing, tympanic membrane, external ear and ear canal normal.  Nose: Mucosal edema and rhinorrhea (Clear) present.  Mouth/Throat: Uvula is midline, oropharynx is clear and moist and mucous membranes are normal.  Eyes: Pupils are equal, round, and reactive to light. Conjunctivae and EOM are normal.  Discs sharp bilaterally  Neck: Normal range of motion and full passive range of motion without pain. Neck supple. No thyromegaly present.  Cardiovascular: Normal rate, regular rhythm, S1 normal and S2 normal. Exam reveals no S3, no S4 and no friction rub.  No murmur heard. No carotid bruits.  Carotid, radial, femoral, DP and PT pulses normal and equal.   Pulmonary/Chest: Effort normal and breath sounds normal. Right breast exhibits no inverted nipple, no mass, no nipple discharge, no skin change and no tenderness. Left breast exhibits no inverted nipple, no mass, no nipple  discharge, no skin change and no tenderness.  Abdominal: Soft. Bowel sounds are normal. She exhibits no mass. There is no hepatosplenomegaly. There is no abdominal tenderness. No hernia.  Genitourinary: Rectum:     Guaiac result negative.     No rectal mass.     Genitourinary Comments: Normal external female genitalia. No cervical lesions. Scant white-yellow discharge. No uterine or adnexal mass or tenderness.    Musculoskeletal: Normal range of motion.        General: No edema.  Lymphadenopathy:       Head (right side): No submental and no submandibular adenopathy present.  Head (left side): No submental and no submandibular adenopathy present.    She has no cervical adenopathy.    She has no axillary adenopathy.       Right: No inguinal and no supraclavicular adenopathy present.       Left: No inguinal and no supraclavicular adenopathy present.  Neurological: She is alert and oriented to person, place, and time. She has normal strength and normal reflexes. No cranial nerve deficit or sensory deficit. Coordination and gait normal.  Skin: Skin is warm. No rash noted.  Psychiatric: She has a normal mood and affect. Her speech is normal and behavior is normal. Judgment and thought content normal. Cognition and memory are normal.     Assessment & Plan  1.  CPE with pap Vaginal discharge with mild BV changes, but patient asymptomatic. Mammogram scheduled To check on Tdap status GI referral for screening colonoscopy.  Dr. Michail Sermon.  2.  Hypertension:  Increase Amlodipine to 10 mg daily, Losartan 50 mg daily and HCTZ 25 mg daily. Repeat bp check in 2 weeks.  3.  Mild Viral syndrome:  Coricidin HBP, supportive care.

## 2018-02-01 LAB — CYTOLOGY - PAP

## 2018-07-28 ENCOUNTER — Ambulatory Visit: Payer: Commercial Managed Care - PPO | Admitting: Internal Medicine

## 2018-09-11 ENCOUNTER — Ambulatory Visit: Payer: Commercial Managed Care - PPO | Admitting: Internal Medicine

## 2018-10-09 ENCOUNTER — Other Ambulatory Visit: Payer: Self-pay | Admitting: Internal Medicine

## 2019-01-15 ENCOUNTER — Other Ambulatory Visit: Payer: Self-pay | Admitting: Internal Medicine

## 2019-01-16 ENCOUNTER — Other Ambulatory Visit: Payer: Self-pay | Admitting: Internal Medicine

## 2019-04-15 ENCOUNTER — Other Ambulatory Visit: Payer: Self-pay | Admitting: Internal Medicine

## 2019-04-27 ENCOUNTER — Other Ambulatory Visit: Payer: Self-pay | Admitting: Internal Medicine

## 2019-05-13 ENCOUNTER — Ambulatory Visit (HOSPITAL_COMMUNITY)
Admission: EM | Admit: 2019-05-13 | Discharge: 2019-05-13 | Disposition: A | Discharge status: Home / Self Care | Payer: Commercial Managed Care - PPO | Attending: Urgent Care | Admitting: Urgent Care

## 2019-05-13 DIAGNOSIS — R1011 Right upper quadrant pain: Secondary | ICD-10-CM | POA: Diagnosis present

## 2019-05-13 DIAGNOSIS — R109 Unspecified abdominal pain: Secondary | ICD-10-CM | POA: Diagnosis not present

## 2019-05-13 DIAGNOSIS — M549 Dorsalgia, unspecified: Secondary | ICD-10-CM | POA: Diagnosis present

## 2019-05-13 DIAGNOSIS — I10 Essential (primary) hypertension: Secondary | ICD-10-CM | POA: Insufficient documentation

## 2019-05-13 LAB — POCT URINALYSIS DIP (DEVICE)
Bilirubin Urine: NEGATIVE
Glucose, UA: NEGATIVE mg/dL
Hgb urine dipstick: NEGATIVE
Ketones, ur: NEGATIVE mg/dL
Leukocytes,Ua: NEGATIVE
Nitrite: NEGATIVE
Protein, ur: NEGATIVE mg/dL
Specific Gravity, Urine: 1.02 (ref 1.005–1.030)
Urobilinogen, UA: 0.2 mg/dL (ref 0.0–1.0)
pH: 7 (ref 5.0–8.0)

## 2019-05-13 MED ORDER — KETOROLAC TROMETHAMINE 60 MG/2ML IM SOLN
60.0000 mg | Freq: Once | INTRAMUSCULAR | Status: AC
  Administered 2019-05-13: 16:00:00 60 mg via INTRAMUSCULAR

## 2019-05-13 MED ORDER — HYDROCODONE-ACETAMINOPHEN 5-325 MG PO TABS: 1.0000 | ORAL_TABLET | Freq: Once | ORAL | Status: DC

## 2019-05-13 MED ORDER — KETOROLAC TROMETHAMINE 60 MG/2ML IM SOLN
INTRAMUSCULAR | Status: AC
  Filled 2019-05-13: qty 2

## 2019-05-13 MED ORDER — NAPROXEN 500 MG PO TABS
500.0000 mg | ORAL_TABLET | Freq: Two times a day (BID) | ORAL | 0 refills | Status: DC
Start: 2019-05-13 — End: 2019-06-01

## 2019-05-13 MED ORDER — TRAMADOL HCL 50 MG PO TABS: 50.0000 mg | ORAL_TABLET | Freq: Four times a day (QID) | ORAL | 0 refills | Status: DC | PRN

## 2019-05-13 NOTE — Discharge Instructions (Addendum)
Make sure you follow-up with your PCP so that you can obtain an outpatient ultrasound to further evaluate your abdominal pain for gall bladder disease or CT scan for kidney stone or other acute abdominal illness.  In the meantime make sure that you take naproxen twice daily with food for pain control and use tramadol for severe pain.  The symptoms persist and you are not able to get in with your PCP to have the outpatient imaging then please report to the emergency room.

## 2019-05-13 NOTE — ED Triage Notes (Addendum)
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 Pt reports feeling needle between her ribs. Pain is worse when taking deep breath.

## 2019-05-13 NOTE — ED Provider Notes (Signed)
[place]   MRN: [id number] DOB: 15-Mar-1965  Subjective:   Cheryl Browning is a 54 y.o. female presenting for 1 day hx of acute onset right flank, RUQ and right thoracic back pain. Sx are severe not helped by APAP. Started in the randomly. 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  Does not do heavy lifting for work.  Denies hx of gallbladder disease, hx of renal stones. Denies fever, n/v, diarrhea, constipation, dysuria, hematuria.   No current facility-administered medications for this encounter.  Current Outpatient Medications:  .  amLODipine (NORVASC) 10 MG tablet, TAKE 1 TABLET BY MOUTH EVERY DAY, Disp: 30 tablet, Rfl: 0 .  hydrochlorothiazide (HYDRODIURIL) 25 MG tablet, TAKE 1 TABLET BY MOUTH EVERY DAY, Disp: 30 tablet, Rfl: 0 .  losartan (COZAAR) 25 MG tablet, TAKE 2 TABLETS BY MOUTH EVERY DAY, Disp: 60 tablet, Rfl: 0   Allergies  Allergen Reactions  . Lisinopril Cough    ACE I cough    Past Medical History:  Diagnosis Date  . Hypertension 2010     No past surgical history on file.  Family History  Problem Relation Age of Onset  . Hypertension Father     Social History   Tobacco Use  . Smoking status: Never Smoker  . Smokeless tobacco: Never Used  Substance Use Topics  . Alcohol use: Never  . Drug use: Never    ROS   Objective:   Vitals: BP 136/78 (BP Location: Left Arm)   Pulse 88   Temp 98.5 F (36.9 C) (Oral)   Resp 18   Physical Exam Constitutional:      General: She is not in acute distress.    Appearance: Normal appearance. She is well-developed. She is obese. She is not ill-appearing, toxic-appearing or diaphoretic.  HENT:     Head: Normocephalic and atraumatic.     Right Ear: External ear normal.     Left Ear: External ear normal.     Nose: Nose normal.     Mouth/Throat:     Mouth: Mucous membranes are moist.     Pharynx: Oropharynx is clear.  Eyes:     General: No scleral icterus.  Extraocular Movements: Extraocular movements intact.     Pupils: Pupils are equal, round, and reactive to light.  Cardiovascular:     Rate and Rhythm: Normal rate and regular rhythm.     Pulses: Normal pulses.     Heart sounds: Normal heart sounds. No murmur. No friction rub. No gallop.   Pulmonary:     Effort: Pulmonary effort is normal. No respiratory distress.     Breath sounds: Normal breath sounds. No stridor. No wheezing, rhonchi or rales.  Abdominal:     General: Bowel sounds are normal. There is distension (not new per patient).     Palpations: Abdomen is soft. There is no mass.     Tenderness: There is abdominal tenderness (RUQ). There is right CVA tenderness. There is no left CVA tenderness, guarding or rebound.  Skin:    General: Skin is warm and dry.     Coloration: Skin is not pale.     Findings: No rash.  Neurological:     General: No focal deficit present.     Mental Status: She is alert and oriented to person, place, and time.  Psychiatric:        Mood and Affect: Mood normal.        Behavior: Behavior normal.  Thought Content: Thought content normal.        Judgment: Judgment normal.    Results for orders placed or performed during the hospital encounter of 05/13/19 (from the past 24 hour(s))  POCT urinalysis dip (device)     Status: None   Collection Time: 05/13/19  4:07 PM  Result Value Ref Range   Glucose, UA NEGATIVE NEGATIVE mg/dL   Bilirubin Urine NEGATIVE NEGATIVE   Ketones, ur NEGATIVE NEGATIVE mg/dL   Specific Gravity, Urine 1.020 1.005 - 1.030   Hgb urine dipstick NEGATIVE NEGATIVE   pH 7.0 5.0 - 8.0   Protein, ur NEGATIVE NEGATIVE mg/dL   Urobilinogen, UA 0.2 0.0 - 1.0 mg/dL   Nitrite NEGATIVE NEGATIVE   Leukocytes,Ua NEGATIVE NEGATIVE    Assessment and Plan :   PDMP not reviewed this encounter.  1. Right flank pain   2. RUQ pain   3. Costovertebral angle tenderness   4. Essential hypertension     Patient has severe  undifferentiated right-sided, right upper quadrant and right flank pain.  Counseled on differential which includes gallbladder disease, renal colic, acute abdomen.  At this time patient vital signs are stable for discharge.  Emphasized need to follow-up with PCP urgently for outpatient imaging.  Recommended she use naproxen for pain control, tramadol for breakthrough pain. Counseled patient on potential for adverse effects with medications prescribed/recommended today, ER and return-to-clinic precautions discussed, patient verbalized understanding.    Jaynee Eagles, [place] 05/13/19 1643

## 2019-05-14 ENCOUNTER — Telehealth: Payer: Self-pay | Admitting: Internal Medicine

## 2019-05-14 LAB — URINE CULTURE: Culture: <10000 — AB

## 2019-05-14 NOTE — Telephone Encounter (Signed)
Patient called requesting Rx on amLODipine (NORVASC) 10 MG tablet to be called in [place].

## 2019-05-16 ENCOUNTER — Other Ambulatory Visit: Payer: Self-pay

## 2019-05-16 MED ORDER — AMLODIPINE BESYLATE 10 MG PO TABS: 10.0000 mg | ORAL_TABLET | Freq: Every day | ORAL | 1 refills | Status: DC

## 2019-05-16 NOTE — Telephone Encounter (Signed)
Rx sent to [facility]

## 2019-05-29 ENCOUNTER — Other Ambulatory Visit: Payer: Self-pay | Admitting: Internal Medicine

## 2019-06-01 ENCOUNTER — Other Ambulatory Visit: Payer: Self-pay

## 2019-06-01 ENCOUNTER — Encounter (HOSPITAL_COMMUNITY): Payer: Self-pay

## 2019-06-01 ENCOUNTER — Ambulatory Visit (INDEPENDENT_AMBULATORY_CARE_PROVIDER_SITE_OTHER): Payer: Commercial Managed Care - PPO

## 2019-06-01 ENCOUNTER — Ambulatory Visit (HOSPITAL_COMMUNITY)
Admission: EM | Admit: 2019-06-01 | Discharge: 2019-06-01 | Disposition: A | Discharge status: Home / Self Care | Payer: Commercial Managed Care - PPO | Attending: Family Medicine | Admitting: Family Medicine

## 2019-06-01 DIAGNOSIS — M79602 Pain in left arm: Secondary | ICD-10-CM

## 2019-06-01 DIAGNOSIS — M7918 Myalgia, other site: Secondary | ICD-10-CM

## 2019-06-01 DIAGNOSIS — M542 Cervicalgia: Secondary | ICD-10-CM | POA: Diagnosis not present

## 2019-06-01 DIAGNOSIS — S161XXA Strain of muscle, fascia and tendon at neck level, initial encounter: Secondary | ICD-10-CM

## 2019-06-01 DIAGNOSIS — S39012A Strain of muscle, fascia and tendon of lower back, initial encounter: Secondary | ICD-10-CM

## 2019-06-01 DIAGNOSIS — M5432 Sciatica, left side: Secondary | ICD-10-CM

## 2019-06-01 DIAGNOSIS — M545 Low back pain: Secondary | ICD-10-CM | POA: Diagnosis not present

## 2019-06-01 MED ORDER — TIZANIDINE HCL 4 MG PO TABS: 4.0000 mg | ORAL_TABLET | Freq: Four times a day (QID) | ORAL | 0 refills | Status: DC | PRN

## 2019-06-01 MED ORDER — METHYLPREDNISOLONE 4 MG PO TBPK
ORAL_TABLET | ORAL | 0 refills | Status: DC
Start: 2019-06-01 — End: 2019-06-22

## 2019-06-01 MED ORDER — NAPROXEN 500 MG PO TABS
500.0000 mg | ORAL_TABLET | Freq: Two times a day (BID) | ORAL | 0 refills | Status: DC
Start: 2019-06-01 — End: 2019-06-22
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
## 2019-06-01 NOTE — Discharge Instructions (Addendum)
Take the medrol as directed Take all of day one today Take the tizanidine as muscle relaxer, this is useful at night May take naproxen after the medrol is completed Ice or heat to painful areas Rest Call your PCP if not improving by Monday to add PT

## 2019-06-07 ENCOUNTER — Other Ambulatory Visit: Payer: Self-pay | Admitting: Internal Medicine

## 2019-06-22 ENCOUNTER — Ambulatory Visit (INDEPENDENT_AMBULATORY_CARE_PROVIDER_SITE_OTHER): Payer: Commercial Managed Care - PPO | Admitting: Internal Medicine

## 2019-06-22 ENCOUNTER — Encounter: Payer: Self-pay | Admitting: Internal Medicine

## 2019-06-22 VITALS — BP 128/82 | HR 74 | Resp 12 | Ht 62.0 in | Wt 175.0 lb

## 2019-06-22 DIAGNOSIS — M542 Cervicalgia: Secondary | ICD-10-CM

## 2019-06-22 DIAGNOSIS — Z1231 Encounter for screening mammogram for malignant neoplasm of breast: Secondary | ICD-10-CM | POA: Diagnosis not present

## 2019-06-22 DIAGNOSIS — I10 Essential (primary) hypertension: Secondary | ICD-10-CM

## 2019-06-22 DIAGNOSIS — Z1159 Encounter for screening for other viral diseases: Secondary | ICD-10-CM

## 2019-06-22 DIAGNOSIS — Z13220 Encounter for screening for lipoid disorders: Secondary | ICD-10-CM

## 2019-06-22 DIAGNOSIS — M545 Low back pain, unspecified: Secondary | ICD-10-CM

## 2019-06-22 DIAGNOSIS — Z79899 Other long term (current) drug therapy: Secondary | ICD-10-CM

## 2019-06-22 DIAGNOSIS — Z114 Encounter for screening for human immunodeficiency virus [HIV]: Secondary | ICD-10-CM

## 2019-06-22 DIAGNOSIS — Z1211 Encounter for screening for malignant neoplasm of colon: Secondary | ICD-10-CM

## 2019-06-22 DIAGNOSIS — D696 Thrombocytopenia, unspecified: Secondary | ICD-10-CM | POA: Insufficient documentation

## 2019-06-22 DIAGNOSIS — G8929 Other chronic pain: Secondary | ICD-10-CM

## 2019-06-22 MED ORDER — TIZANIDINE HCL 4 MG PO TABS: 4.0000 mg | ORAL_TABLET | Freq: Every day | ORAL | 0 refills | Status: DC

## 2019-06-22 MED ORDER — AMLODIPINE BESYLATE 10 MG PO TABS: 10.0000 mg | ORAL_TABLET | Freq: Every day | ORAL | 3 refills | Status: DC

## 2019-06-22 MED ORDER — NAPROXEN 500 MG PO TABS: ORAL_TABLET | ORAL | 0 refills | Status: DC

## 2019-06-22 MED ORDER — HYDROCHLOROTHIAZIDE 25 MG PO TABS: 25.0000 mg | ORAL_TABLET | Freq: Every day | ORAL | 3 refills | Status: DC

## 2019-06-22 MED ORDER — LOSARTAN POTASSIUM 50 MG PO TABS: ORAL_TABLET | ORAL | 3 refills | Status: DC
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
## 2019-06-23 ENCOUNTER — Other Ambulatory Visit: Payer: Self-pay | Admitting: Internal Medicine

## 2019-06-23 LAB — CBC WITH DIFFERENTIAL/PLATELET
Basophils Absolute: 0 10*3/uL (ref 0.0–0.2)
Basos: 1 %
EOS (ABSOLUTE): 0.1 10*3/uL (ref 0.0–0.4)
Eos: 1 %
Hematocrit: 42.3 % (ref 34.0–46.6)
Hemoglobin: 14.2 g/dL (ref 11.1–15.9)
Immature Grans (Abs): 0 10*3/uL (ref 0.0–0.1)
Immature Granulocytes: 1 %
Lymphocytes Absolute: 2.4 10*3/uL (ref 0.7–3.1)
Lymphs: 45 %
MCH: 28.9 pg (ref 26.6–33.0)
MCHC: 33.6 g/dL (ref 31.5–35.7)
MCV: 86 fL (ref 79–97)
Monocytes Absolute: 0.4 10*3/uL (ref 0.1–0.9)
Monocytes: 8 %
Neutrophils Absolute: 2.4 10*3/uL (ref 1.4–7.0)
Neutrophils: 44 %
Platelets: 164 10*3/uL (ref 150–450)
RBC: 4.91 x10E6/uL (ref 3.77–5.28)
RDW: 14.8 % (ref 11.7–15.4)
WBC: 5.4 10*3/uL (ref 3.4–10.8)

## 2019-06-23 LAB — COMPREHENSIVE METABOLIC PANEL
ALT: 17 IU/L (ref 0–32)
AST: 21 IU/L (ref 0–40)
Albumin/Globulin Ratio: 1.3 (ref 1.2–2.2)
Albumin: 4.3 g/dL (ref 3.8–4.9)
Alkaline Phosphatase: 92 IU/L (ref 48–121)
BUN/Creatinine Ratio: 17 (ref 9–23)
BUN: 13 mg/dL (ref 6–24)
Bilirubin Total: 0.2 mg/dL (ref 0.0–1.2)
CO2: 22 mmol/L (ref 20–29)
Calcium: 9.8 mg/dL (ref 8.7–10.2)
Chloride: 108 mmol/L — ABNORMAL HIGH (ref 96–106)
Creatinine, Ser: 0.77 mg/dL (ref 0.57–1.00)
GFR calc Af Amer: 103 mL/min/{1.73_m2} (ref >59)
GFR calc non Af Amer: 89 mL/min/{1.73_m2} (ref >59)
Globulin, Total: 3.4 g/dL (ref 1.5–4.5)
Glucose: 74 mg/dL (ref 65–99)
Potassium: 3.8 mmol/L (ref 3.5–5.2)
Sodium: 149 mmol/L — ABNORMAL HIGH (ref 134–144)
Total Protein: 7.7 g/dL (ref 6.0–8.5)

## 2019-06-23 LAB — LIPID PANEL W/O CHOL/HDL RATIO
Cholesterol, Total: 149 mg/dL (ref 100–199)
HDL: 68 mg/dL (ref >39)
LDL Chol Calc (NIH): 68 mg/dL (ref 0–99)
Triglycerides: 61 mg/dL (ref 0–149)
VLDL Cholesterol Cal: 13 mg/dL (ref 5–40)

## 2019-06-23 LAB — HIV ANTIBODY (ROUTINE TESTING W REFLEX): HIV Screen 4th Generation wRfx: NONREACTIVE

## 2019-06-23 LAB — HEPATITIS C ANTIBODY: Hep C Virus Ab: <0.1 s/co ratio (ref 0.0–0.9)

## 2019-07-03 ENCOUNTER — Other Ambulatory Visit: Payer: Self-pay | Admitting: Internal Medicine

## 2019-07-03 NOTE — Telephone Encounter (Signed)
To Dr. Mulberry for approval 

## 2019-07-19 ENCOUNTER — Other Ambulatory Visit: Payer: Self-pay | Admitting: Internal Medicine

## 2019-07-29 ENCOUNTER — Other Ambulatory Visit: Payer: Self-pay | Admitting: Internal Medicine

## 2019-08-13 ENCOUNTER — Other Ambulatory Visit: Payer: Self-pay | Admitting: Internal Medicine

## 2019-08-15 ENCOUNTER — Other Ambulatory Visit: Payer: Self-pay | Admitting: Internal Medicine

## 2019-08-16 NOTE — Telephone Encounter (Signed)
To Dr. Mulberry for approval 

## 2019-09-15 ENCOUNTER — Other Ambulatory Visit: Payer: Self-pay | Admitting: Internal Medicine

## 2019-09-25 ENCOUNTER — Ambulatory Visit: Payer: Commercial Managed Care - PPO | Admitting: Internal Medicine

## 2019-11-08 ENCOUNTER — Other Ambulatory Visit: Payer: Self-pay | Admitting: Internal Medicine

## 2019-11-08 DIAGNOSIS — Z1231 Encounter for screening mammogram for malignant neoplasm of breast: Secondary | ICD-10-CM

## 2019-12-24 ENCOUNTER — Telehealth: Payer: Self-pay | Admitting: Internal Medicine

## 2019-12-24 NOTE — Telephone Encounter (Signed)
Patient's husband, Nilda Calamity, called requesting patient's medical records from the appointment with Dr. Amil Amen on 06/22/2019. Please call when ready to be pick up.

## 2020-06-12 ENCOUNTER — Other Ambulatory Visit: Payer: Self-pay | Admitting: Internal Medicine

## 2020-06-24 ENCOUNTER — Other Ambulatory Visit (HOSPITAL_COMMUNITY): Payer: Self-pay

## 2020-06-25 ENCOUNTER — Other Ambulatory Visit (HOSPITAL_COMMUNITY): Payer: Self-pay

## 2020-06-25 ENCOUNTER — Other Ambulatory Visit: Payer: Self-pay | Admitting: Internal Medicine

## 2020-06-26 ENCOUNTER — Other Ambulatory Visit: Payer: Self-pay | Admitting: Internal Medicine

## 2020-06-26 ENCOUNTER — Other Ambulatory Visit (HOSPITAL_COMMUNITY): Payer: Self-pay

## 2020-06-26 MED ORDER — LOSARTAN POTASSIUM 50 MG PO TABS
ORAL_TABLET | ORAL | 1 refills | Status: DC
  Filled 2020-06-26: qty 90, 90d supply, fill #0

## 2020-06-27 ENCOUNTER — Other Ambulatory Visit (HOSPITAL_COMMUNITY): Payer: Self-pay

## 2020-07-01 ENCOUNTER — Other Ambulatory Visit (HOSPITAL_COMMUNITY): Payer: Self-pay

## 2020-07-01 MED ORDER — HYDROCHLOROTHIAZIDE 25 MG PO TABS
25.0000 mg | ORAL_TABLET | Freq: Every day | ORAL | 1 refills | Status: DC
  Filled 2020-07-01: qty 90, 90d supply, fill #0

## 2020-07-01 MED ORDER — AMLODIPINE BESYLATE 10 MG PO TABS
10.0000 mg | ORAL_TABLET | Freq: Every day | ORAL | 1 refills | Status: DC
  Filled 2020-07-01: qty 90, 90d supply, fill #0

## 2020-09-05 ENCOUNTER — Other Ambulatory Visit (HOSPITAL_COMMUNITY): Payer: Self-pay

## 2020-09-05 ENCOUNTER — Ambulatory Visit: Payer: Self-pay | Admitting: Internal Medicine

## 2020-09-05 ENCOUNTER — Other Ambulatory Visit: Payer: Self-pay

## 2020-09-05 ENCOUNTER — Encounter: Payer: Self-pay | Admitting: Internal Medicine

## 2020-09-05 VITALS — BP 132/78 | HR 72 | Resp 20 | Ht 62.0 in | Wt 170.0 lb

## 2020-09-05 DIAGNOSIS — Z1231 Encounter for screening mammogram for malignant neoplasm of breast: Secondary | ICD-10-CM

## 2020-09-05 DIAGNOSIS — Z780 Asymptomatic menopausal state: Secondary | ICD-10-CM

## 2020-09-05 DIAGNOSIS — Z0000 Encounter for general adult medical examination without abnormal findings: Secondary | ICD-10-CM

## 2020-09-05 DIAGNOSIS — Z9229 Personal history of other drug therapy: Secondary | ICD-10-CM

## 2020-09-05 DIAGNOSIS — Z23 Encounter for immunization: Secondary | ICD-10-CM

## 2020-09-05 DIAGNOSIS — Z1211 Encounter for screening for malignant neoplasm of colon: Secondary | ICD-10-CM

## 2020-09-05 DIAGNOSIS — Z13220 Encounter for screening for lipoid disorders: Secondary | ICD-10-CM

## 2020-09-05 MED ORDER — LOSARTAN POTASSIUM 50 MG PO TABS
50.0000 mg | ORAL_TABLET | Freq: Every day | ORAL | 3 refills | Status: DC
  Filled 2020-09-05 – 2020-10-09 (×2): qty 90, 90d supply, fill #0
  Filled 2021-01-09: qty 90, 90d supply, fill #1
  Filled 2021-04-08: qty 90, 90d supply, fill #2

## 2020-09-05 MED ORDER — HYDROCHLOROTHIAZIDE 25 MG PO TABS
25.0000 mg | ORAL_TABLET | Freq: Every day | ORAL | 3 refills | Status: DC
  Filled 2020-09-05 – 2020-10-09 (×2): qty 90, 90d supply, fill #0
  Filled 2021-01-09: qty 90, 90d supply, fill #1
  Filled 2021-04-08: qty 90, 90d supply, fill #2

## 2020-09-05 MED ORDER — AMLODIPINE BESYLATE 10 MG PO TABS
10.0000 mg | ORAL_TABLET | Freq: Every day | ORAL | 3 refills | Status: DC
  Filled 2020-09-05 – 2020-10-09 (×2): qty 90, 90d supply, fill #0
  Filled 2021-01-09: qty 90, 90d supply, fill #1
  Filled 2021-04-08: qty 90, 90d supply, fill #2

## 2020-09-05 NOTE — Progress Notes (Signed)
Subjective:    Patient ID: Cheryl Browning, female   DOB: 03/01/1965, 55 y.o.   MRN: [id number]   HPI  CPE without pap  1.  Pap:  Last performed 01/27/2018 and normal.    2.  Mammogram:  None since 06/2008. Normal then.  States could never get an appt when working 3rd shift at [employer].  No family history of breast cancer.    3.  Osteoprevention:  Has an old Mirena IUD in--she has not made the appt to have removed at [facility].  Was told it was for 5 years.  Has had hot flashes and night sweats for past 2 years.  She has not had periods since IUD placement, so hard to say if postmenopausal or perimenopausal.  She drinks only once cup of 2% milk daily, but can increase to 3 and eat one serving of yogurt.  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  4.  Guaiac Cards:  Never.  5.  Colonoscopy:  Did not go last year.  Just never found the time.  6.  Immunizations:  Has not had Shingles vaccination.  Only 2 Moderna Covid vaccines thus far.    7.  Glucose/Cholesterol :  Glucose and cholesterol fine in past.  Current Meds  Medication Sig   amLODipine (NORVASC) 10 MG tablet Take 1 tablet (10 mg total) by mouth daily.   hydrochlorothiazide (HYDRODIURIL) 25 MG tablet Take 1 tablet (25 mg total) by mouth daily.   losartan (COZAAR) 50 MG tablet Take 1 tablet by mouth daily   Allergies  Allergen Reactions   Lisinopril Cough    ACE I cough   Past Medical History:  Diagnosis Date   Hypertension 2010   No past surgical history on file.  Family History  Problem Relation Age of Onset   Hypertension Father    Colon cancer Neg Hx    Colon polyps Neg Hx    Social History   Socioeconomic History   Marital status: Married    Spouse name: Biomedical scientist   Number of children: 5   Years of education: Not on file   Highest education level: Master's degree (e.g., MA, MS, MEng, MEd, MSW, MBA)  Occupational History   Occupation: nurse  at  [employer]  Tobacco Use   Smoking status: Never   Smokeless tobacco: Never  Vaping Use   Vaping Use: Never used  Substance and Sexual Activity   Alcohol use: Never   Drug use: Never   Sexual activity: Yes    Birth control/protection: I.U.D.    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   Social Determinants of Health   Financial Resource Strain: Not on file  Food Insecurity: No Food Insecurity (09/05/2020)   Hunger Vital Sign    Worried About Running Out of Food in the Last Year: Never true    Ran Out of Food in the Last Year: Never true  Transportation Needs: No Transportation Needs (09/05/2020)   PRAPARE - Hydrologist (Medical): No    Lack of Transportation (Non-Medical): No  Physical Activity: Not on file  Stress: No Stress Concern Present (10/24/2017)   [place]  Stress Questionnaire    Feeling of Stress : Only a little  Social Connections: Not on file  Intimate Partner Violence: Not At Risk (09/05/2020)   Humiliation, Afraid, Rape, and Kick questionnaire    Fear of Current or Ex-Partner: No    Emotionally Abused: No    Physically Abused: No    Sexually Abused: No      Review of Systems  HENT:  Negative for hearing loss.   Eyes:  Positive for visual disturbance (Difficulty with small print even with reading glasses.).  Respiratory:  Negative for shortness of breath.   Cardiovascular:  Negative for chest pain, palpitations and leg swelling.  Gastrointestinal:  Negative for abdominal pain, blood in stool (No melena), constipation and diarrhea.  Genitourinary:  Negative for vaginal bleeding and vaginal discharge.  Musculoskeletal:  Negative for arthralgias.  Skin:  Negative for rash.  Neurological:  Negative for  weakness and numbness.  Psychiatric/Behavioral:  Negative for dysphoric mood. The patient is not nervous/anxious.      Objective:   BP 132/78 (BP Location: Left Arm, Patient Position: Sitting, Cuff Size: Normal)   Pulse 72   Resp 20   Ht '5\' 2"'$  (1.575 m)   Wt 170 lb (77.1 kg)   BMI 31.09 kg/m   Physical Exam HENT:     Head: Normocephalic and atraumatic.     Right Ear: Tympanic membrane, ear canal and external ear normal.     Left Ear: Tympanic membrane, ear canal and external ear normal.     Nose: Nose normal.     Mouth/Throat:     Mouth: Mucous membranes are moist.     Pharynx: Oropharynx is clear.  Eyes:     Extraocular Movements: Extraocular movements intact.     Conjunctiva/sclera: Conjunctivae normal.     Pupils: Pupils are equal, round, and reactive to light.     Comments: Disc sharp  Neck:     Thyroid: No thyroid mass or thyromegaly.  Cardiovascular:     Rate and Rhythm: Normal rate and regular rhythm.     Heart sounds: S1 normal and S2 normal. No murmur heard.    No friction rub. No S3 or S4 sounds.     Comments: No carotid bruits.  Carotid, radial, femoral, DP and PT pulses normal and equal.    Pulmonary:     Effort: Pulmonary effort is normal.     Breath sounds: Normal breath sounds.  Chest:  Breasts:    Right: No inverted nipple, mass, nipple discharge or skin change.     Left: No inverted nipple, mass, nipple discharge or skin change.  Abdominal:     General: Bowel sounds are normal.     Palpations: Abdomen is soft. There is no hepatomegaly, splenomegaly or mass.     Tenderness: There is no abdominal tenderness.     Hernia: No hernia is present.  Genitourinary:    Comments: Normal external female genitalia No uterine or adnexal mass or tenderness Musculoskeletal:        General: Normal range of motion.     Cervical back: Normal range of motion and neck supple.     Right lower leg: No edema.     Left lower leg: No edema.  Lymphadenopathy:     Head:      Right side of head: No submental or submandibular adenopathy.     Left side of head: No submental or submandibular adenopathy.     Cervical: No cervical adenopathy.  Upper Body:     Right upper body: No supraclavicular or axillary adenopathy.     Left upper body: No supraclavicular or axillary adenopathy.     Lower Body: No right inguinal adenopathy. No left inguinal adenopathy.  Skin:    General: Skin is warm.     Capillary Refill: Capillary refill takes less than 2 seconds.     Findings: No rash.  Neurological:     General: No focal deficit present.     Mental Status: She is alert and oriented to person, place, and time.     Cranial Nerves: Cranial nerves 2-12 are intact.     Sensory: Sensation is intact.     Motor: Motor function is intact.     Coordination: Coordination is intact.     Gait: Gait is intact.     Deep Tendon Reflexes: Reflexes are normal and symmetric.  Psychiatric:        Speech: Speech normal.        Behavior: Behavior normal. Behavior is cooperative.      Assessment & Plan    CPE  without pap Mammogram, DEXA Moderna COVID booster Shingrix Encouraged signing up for influenza clinic or obtain with work. Referral for screening colonoscopy CBC, CMP, FLP  2.  Menopause  3.  Decreased visual acuity:  recommended Ophthalmology  4.  Hypertension:  controlled with 3 meds.  5.  IUD:   to call [facility] for removal

## 2020-09-05 NOTE — Patient Instructions (Signed)
Recommend influenza vaccine at work or call to see when we get set up with influenza vaccine clinics. Recommend a second Shingrix vaccine in 2-6 months Recommend obtaining the new mix of Moderna or Pfizer vaccine for covid with better coverage for omicron variant

## 2020-09-06 LAB — CBC WITH DIFFERENTIAL/PLATELET
Basophils Absolute: 0 10*3/uL (ref 0.0–0.2)
Basos: 1 %
EOS (ABSOLUTE): 0.1 10*3/uL (ref 0.0–0.4)
Eos: 1 %
Hematocrit: 42 % (ref 34.0–46.6)
Hemoglobin: 13.9 g/dL (ref 11.1–15.9)
Immature Grans (Abs): 0 10*3/uL (ref 0.0–0.1)
Immature Granulocytes: 0 %
Lymphocytes Absolute: 2.5 10*3/uL (ref 0.7–3.1)
Lymphs: 44 %
MCH: 27.9 pg (ref 26.6–33.0)
MCHC: 33.1 g/dL (ref 31.5–35.7)
MCV: 84 fL (ref 79–97)
Monocytes Absolute: 0.4 10*3/uL (ref 0.1–0.9)
Monocytes: 6 %
Neutrophils Absolute: 2.8 10*3/uL (ref 1.4–7.0)
Neutrophils: 48 %
Platelets: 201 10*3/uL (ref 150–450)
RBC: 4.99 x10E6/uL (ref 3.77–5.28)
RDW: 13.7 % (ref 11.7–15.4)
WBC: 5.7 10*3/uL (ref 3.4–10.8)

## 2020-09-06 LAB — COMPREHENSIVE METABOLIC PANEL
ALT: 23 IU/L (ref 0–32)
AST: 27 IU/L (ref 0–40)
Albumin/Globulin Ratio: 1.2 (ref 1.2–2.2)
Albumin: 4.3 g/dL (ref 3.8–4.9)
Alkaline Phosphatase: 74 IU/L (ref 44–121)
BUN/Creatinine Ratio: 10 (ref 9–23)
BUN: 8 mg/dL (ref 6–24)
Bilirubin Total: 0.4 mg/dL (ref 0.0–1.2)
CO2: 24 mmol/L (ref 20–29)
Calcium: 10 mg/dL (ref 8.7–10.2)
Chloride: 104 mmol/L (ref 96–106)
Creatinine, Ser: 0.78 mg/dL (ref 0.57–1.00)
Globulin, Total: 3.6 g/dL (ref 1.5–4.5)
Glucose: 83 mg/dL (ref 65–99)
Potassium: 3.7 mmol/L (ref 3.5–5.2)
Sodium: 143 mmol/L (ref 134–144)
Total Protein: 7.9 g/dL (ref 6.0–8.5)
eGFR: 91 mL/min/{1.73_m2} (ref >59)

## 2020-09-06 LAB — LIPID PANEL W/O CHOL/HDL RATIO
Cholesterol, Total: 120 mg/dL (ref 100–199)
HDL: 62 mg/dL (ref >39)
LDL Chol Calc (NIH): 46 mg/dL (ref 0–99)
Triglycerides: 54 mg/dL (ref 0–149)
VLDL Cholesterol Cal: 12 mg/dL (ref 5–40)

## 2020-09-09 ENCOUNTER — Other Ambulatory Visit (HOSPITAL_COMMUNITY): Payer: Self-pay

## 2020-09-17 ENCOUNTER — Other Ambulatory Visit (HOSPITAL_COMMUNITY): Payer: Self-pay

## 2020-10-09 ENCOUNTER — Other Ambulatory Visit (HOSPITAL_COMMUNITY): Payer: Self-pay

## 2020-10-09 ENCOUNTER — Encounter: Payer: Self-pay | Admitting: Gastroenterology

## 2020-11-12 DIAGNOSIS — Z113 Encounter for screening for infections with a predominantly sexual mode of transmission: Secondary | ICD-10-CM | POA: Diagnosis not present

## 2020-11-12 DIAGNOSIS — Z3009 Encounter for other general counseling and advice on contraception: Secondary | ICD-10-CM | POA: Diagnosis not present

## 2020-11-12 DIAGNOSIS — Z30432 Encounter for removal of intrauterine contraceptive device: Secondary | ICD-10-CM | POA: Diagnosis not present

## 2020-11-12 DIAGNOSIS — Z30431 Encounter for routine checking of intrauterine contraceptive device: Secondary | ICD-10-CM | POA: Diagnosis not present

## 2020-12-04 ENCOUNTER — Other Ambulatory Visit (HOSPITAL_COMMUNITY): Payer: Self-pay

## 2020-12-04 ENCOUNTER — Ambulatory Visit (AMBULATORY_SURGERY_CENTER): Payer: Self-pay | Admitting: *Deleted

## 2020-12-04 ENCOUNTER — Other Ambulatory Visit: Payer: Self-pay

## 2020-12-04 VITALS — Ht 63.0 in | Wt 169.0 lb

## 2020-12-04 DIAGNOSIS — Z1211 Encounter for screening for malignant neoplasm of colon: Secondary | ICD-10-CM

## 2020-12-04 MED ORDER — NA SULFATE-K SULFATE-MG SULF 17.5-3.13-1.6 GM/177ML PO SOLN
1.0000 | Freq: Once | ORAL | 0 refills | Status: AC
  Filled 2020-12-04: qty 354, 1d supply, fill #0

## 2020-12-04 NOTE — Progress Notes (Signed)
PV completed over the phone. Pt verified name, DOB, address and insurance during PV today.  Pt mailed instruction packet with copy of consent form to read and not return, and instructions.   Pt encouraged to call with questions or issues.  If pt has My chart, procedure instructions sent via My Chart   No egg or soy allergy known to patient  No issues known to pt with past sedation with any surgeries or procedures Patient denies  past intubation  No FH of Malignant Hyperthermia Pt is not on diet pills Pt is not on  home 02  Pt is not on blood thinners  Pt denies issues with constipation  No A fib or A flutter  Pt is fully vaccinated  for Covid     NO PA's for preps discussed with pt In PV today  Discussed with pt there will be an out-of-pocket cost for prep and that varies from $0 to 70 +  dollars - pt verbalized understanding   Due to the COVID-19 pandemic we are asking patients to follow certain guidelines in PV and the [place]   Pt aware of COVID protocols and [facility] guidelines   Also Emailed instructions to [email or web address]

## 2020-12-05 ENCOUNTER — Other Ambulatory Visit (HOSPITAL_COMMUNITY): Payer: Self-pay

## 2020-12-11 ENCOUNTER — Encounter: Payer: Self-pay | Admitting: Gastroenterology

## 2020-12-19 ENCOUNTER — Other Ambulatory Visit: Payer: Self-pay

## 2020-12-19 ENCOUNTER — Ambulatory Visit (AMBULATORY_SURGERY_CENTER): Payer: 59 | Admitting: Gastroenterology

## 2020-12-19 ENCOUNTER — Encounter: Payer: Self-pay | Admitting: Gastroenterology

## 2020-12-19 VITALS — BP 119/61 | HR 82 | Temp 97.8°F | Resp 19 | Ht 62.0 in | Wt 169.0 lb

## 2020-12-19 DIAGNOSIS — Z1211 Encounter for screening for malignant neoplasm of colon: Secondary | ICD-10-CM | POA: Diagnosis not present

## 2020-12-19 DIAGNOSIS — D120 Benign neoplasm of cecum: Secondary | ICD-10-CM

## 2020-12-19 MED ORDER — SODIUM CHLORIDE 0.9 % IV SOLN: 500.0000 mL | Freq: Once | INTRAVENOUS | Status: DC

## 2020-12-19 NOTE — Progress Notes (Signed)
HPI: This is a at routine risk fro CRC   ROS: complete GI ROS as described in HPI, all other review negative.  Constitutional:  No unintentional weight loss   Past Medical History:  Diagnosis Date   Hypertension 2010    History reviewed. No pertinent surgical history.  Current Outpatient Medications  Medication Sig Dispense Refill   amLODipine (NORVASC) 10 MG tablet Take 1 tablet (10 mg total) by mouth daily. 90 tablet 3   hydrochlorothiazide (HYDRODIURIL) 25 MG tablet Take 1 tablet (25 mg total) by mouth daily. 90 tablet 3   losartan (COZAAR) 50 MG tablet Take 1 tablet (50 mg total) by mouth daily. 90 tablet 3   Multiple Vitamins-Minerals (MULTIVITAMIN WOMEN 50+ PO) Take by mouth daily.     Current Facility-Administered Medications  Medication Dose Route Frequency Provider Last Rate Last Admin   0.9 %  sodium chloride infusion  500 mL Intravenous Once Milus Banister, MD        Allergies as of 12/19/2020 - Review Complete 12/19/2020  Allergen Reaction Noted   Lisinopril Cough 10/24/2017    Family History  Problem Relation Age of Onset   Hypertension Father    Colon cancer Neg Hx    Colon polyps Neg Hx     Social History   Socioeconomic History   Marital status: Married    Spouse name: Biomedical scientist   Number of children: 5   Years of education: Not on file   Highest education level: Master's degree (e.g., MA, MS, MEng, MEd, MSW, MBA)  Occupational History   Occupation: Marine scientist at  [employer]  Tobacco Use   Smoking status: Never   Smokeless tobacco: Never  Vaping Use   Vaping Use: Never used  Substance and Sexual Activity   Alcohol use: Never   Drug use: Never   Sexual activity: Yes    Birth control/protection: I.U.D.    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   Social  Determinants of Health   Financial Resource Strain: Not on file  Food Insecurity: No Food Insecurity   Worried About Charity fundraiser in the Last Year: Never true   Ran Out of Food in the Last Year: Never true  Transportation Needs: No Transportation Needs   Lack of Transportation (Medical): No   Lack of Transportation (Non-Medical): No  Physical Activity: Not on file  Stress: Not on file  Social Connections: Not on file  Intimate Partner Violence: Not At Risk   Fear of Current or Ex-Partner: No   Emotionally Abused: No   Physically Abused: No   Sexually Abused: No     Physical Exam: BP 139/75    Pulse 90    Temp 97.8 F (36.6 C)    Ht 5\' 2"  (1.575 m)    Wt 169 lb (76.7 kg)    LMP  (LMP Unknown)    SpO2 98%    BMI 30.91 kg/m  Constitutional: generally well-appearing Psychiatric: alert and oriented x3 Lungs: CTA bilaterally Heart: no MCR  Assessment and plan: 55 y.o. female with routine risk for CRC  Screening colonoscopy today  Care is appropriate for the ambulatory setting.  Owens Loffler, MD [facility] 12/19/2020, 9:14 AM

## 2020-12-19 NOTE — Progress Notes (Signed)
Called to room to assist during endoscopic procedure.  Patient ID and intended procedure confirmed with present staff. Received instructions for my participation in the procedure from the performing physician.  

## 2020-12-19 NOTE — Op Note (Signed)
[facility] Patient Name: Cheryl Browning Procedure Date: 12/19/2020 9:11 AM MRN: [id number] Endoscopist: Milus Banister , MD Age: 55 Referring MD:  Date of Birth: 1965-01-13 Gender: Female Account #: [id number] Procedure:                Colonoscopy Indications:              Screening for colorectal malignant neoplasm Medicines:                Monitored Anesthesia Care Procedure:                Pre-Anesthesia Assessment:                           - Prior to the procedure, a History and Physical                            was performed, and patient medications and                            allergies were reviewed. The patient's tolerance of                            previous anesthesia was also reviewed. The risks                            and benefits of the procedure and the sedation                            options and risks were discussed with the patient.                            All questions were answered, and informed consent                            was obtained. Prior Anticoagulants: The patient has                            taken no previous anticoagulant or antiplatelet                            agents. ASA Grade Assessment: II - A patient with                            mild systemic disease. After reviewing the risks                            and benefits, the patient was deemed in                            satisfactory condition to undergo the procedure.                           After obtaining informed consent, the colonoscope  was passed under direct vision. Throughout the                            procedure, the patient's blood pressure, pulse, and                            oxygen saturations were monitored continuously. The                            Olympus CF-HQ190L ([id number]) Colonoscope was                            introduced through the anus and advanced to the the                            cecum,  identified by appendiceal orifice and                            ileocecal valve. The colonoscopy was performed                            without difficulty. The patient tolerated the                            procedure well. The quality of the bowel                            preparation was good. Scope In: 9:20:01 AM Scope Out: 9:36:56 AM Scope Withdrawal Time: 0 hours 14 minutes 15 seconds  Total Procedure Duration: 0 hours 16 minutes 55 seconds  Findings:                 A 3 mm polyp was found in the cecum. The polyp was                            sessile. The polyp was removed with a cold snare.                            Resection and retrieval were complete.                           The exam was otherwise without abnormality on                            direct and retroflexion views. Complications:            No immediate complications. Estimated blood loss:                            None. Estimated Blood Loss:     Estimated blood loss: none. Impression:               - One 3 mm polyp in the cecum, removed with a cold  snare. Resected and retrieved.                           - The examination was otherwise normal on direct                            and retroflexion views. Recommendation:           - Patient has a contact number available for                            emergencies. The signs and symptoms of potential                            delayed complications were discussed with the                            patient. Return to normal activities tomorrow.                            Written discharge instructions were provided to the                            patient.                           - Resume previous diet.                           - Continue present medications.                           - Await pathology results. Milus Banister, MD 12/19/2020 9:39:17 AM This report has been signed electronically.

## 2020-12-19 NOTE — Patient Instructions (Signed)
Read all of the handouts given to you by your recovery room nurse. ? ?YOU HAD AN ENDOSCOPIC PROCEDURE TODAY AT THE [facility]:   Refer to the procedure report that was given to you for any specific questions about what was found during the examination.  If the procedure report does not answer your questions, please call your gastroenterologist to clarify.  If you requested that your care partner not be given the details of your procedure findings, then the procedure report has been included in a sealed envelope for you to review at your convenience later. ? ?YOU SHOULD EXPECT: Some feelings of bloating in the abdomen. Passage of more gas than usual.  Walking can help get rid of the air that was put into your GI tract during the procedure and reduce the bloating. If you had a lower endoscopy (such as a colonoscopy or flexible sigmoidoscopy) you may notice spotting of blood in your stool or on the toilet paper. If you underwent a bowel prep for your procedure, you may not have a normal bowel movement for a few days. ? ?Please Note:  You might notice some irritation and congestion in your nose or some drainage.  This is from the oxygen used during your procedure.  There is no need for concern and it should clear up in a day or so. ? ?SYMPTOMS TO REPORT IMMEDIATELY: ? ?Following lower endoscopy (colonoscopy or flexible sigmoidoscopy): ? Excessive amounts of blood in the stool ? Significant tenderness or worsening of abdominal pains ? Swelling of the abdomen that is new, acute ? Fever of 100?F or higher ? ?  ?For urgent or emergent issues, a gastroenterologist can be reached at any hour by calling [phone]. ?Do not use MyChart messaging for urgent concerns.  ? ? ?DIET:  We do recommend a small meal at first, but then you may proceed to your regular diet.  Drink plenty of fluids but you should avoid alcoholic beverages for 24 hours. ? ?ACTIVITY:  You should plan to take it easy for the rest of today  and you should NOT DRIVE or use heavy machinery until tomorrow (because of the sedation medicines used during the test).   ? ?FOLLOW UP: ?Our staff will call the number listed on your records 48-72 hours following your procedure to check on you and address any questions or concerns that you may have regarding the information given to you following your procedure. If we do not reach you, we will leave a message.  We will attempt to reach you two times.  During this call, we will ask if you have developed any symptoms of COVID 19. If you develop any symptoms (ie: fever, flu-like symptoms, shortness of breath, cough etc.) before then, please call [phone].  If you test positive for Covid 19 in the 2 weeks post procedure, please call and report this information to us.   ? ?If any biopsies were taken you will be contacted by phone or by letter within the next 1-3 weeks.  Please call us at [phone] if you have not heard about the biopsies in 3 weeks.  ? ? ?SIGNATURES/CONFIDENTIALITY: ?You and/or your care partner have signed paperwork which will be entered into your electronic medical record.  These signatures attest to the fact that that the information above on your After Visit Summary has been reviewed and is understood.  Full responsibility of the confidentiality of this discharge information lies with you and/or your care-partner.  ?

## 2020-12-19 NOTE — Progress Notes (Signed)
Sedate, gd SR, tolerated procedure well, VSS, report to RN 

## 2020-12-19 NOTE — Progress Notes (Signed)
Vs DT   Pt's states no medical or surgical changes since previsit or office visit.

## 2020-12-23 ENCOUNTER — Telehealth: Payer: Self-pay

## 2020-12-23 NOTE — Telephone Encounter (Signed)
°  Follow up Call-  Call back number 12/19/2020  Post procedure Call Back phone  # [phone]  Permission to leave phone message Yes  Some recent data might be hidden     Left message

## 2020-12-23 NOTE — Telephone Encounter (Signed)
Left message on follow up call. 

## 2021-01-09 ENCOUNTER — Other Ambulatory Visit (HOSPITAL_COMMUNITY): Payer: Self-pay

## 2021-03-13 ENCOUNTER — Ambulatory Visit: Payer: 59

## 2021-03-13 ENCOUNTER — Inpatient Hospital Stay: Admission: RE | Admit: 2021-03-13 | Payer: 59 | Source: Ambulatory Visit

## 2021-04-08 ENCOUNTER — Other Ambulatory Visit (HOSPITAL_COMMUNITY): Payer: Self-pay
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
## 2021-09-28 ENCOUNTER — Other Ambulatory Visit: Payer: Self-pay | Admitting: Internal Medicine

## 2021-09-29 ENCOUNTER — Other Ambulatory Visit (HOSPITAL_COMMUNITY): Payer: Self-pay

## 2021-09-30 ENCOUNTER — Other Ambulatory Visit (HOSPITAL_COMMUNITY): Payer: Self-pay

## 2021-09-30 MED ORDER — HYDROCHLOROTHIAZIDE 25 MG PO TABS
25.0000 mg | ORAL_TABLET | Freq: Every day | ORAL | 0 refills | Status: DC
  Filled 2021-09-30 – 2021-10-21 (×3): qty 30, 30d supply, fill #0

## 2021-09-30 MED ORDER — AMLODIPINE BESYLATE 10 MG PO TABS
10.0000 mg | ORAL_TABLET | Freq: Every day | ORAL | 0 refills | Status: DC
  Filled 2021-09-30 – 2021-10-21 (×3): qty 30, 30d supply, fill #0

## 2021-09-30 MED ORDER — LOSARTAN POTASSIUM 50 MG PO TABS
50.0000 mg | ORAL_TABLET | Freq: Every day | ORAL | 0 refills | Status: DC
  Filled 2021-09-30 – 2021-10-21 (×3): qty 30, 30d supply, fill #0

## 2021-10-09 ENCOUNTER — Other Ambulatory Visit (HOSPITAL_COMMUNITY): Payer: Self-pay

## 2021-10-21 ENCOUNTER — Other Ambulatory Visit: Payer: Self-pay

## 2021-10-21 ENCOUNTER — Other Ambulatory Visit (HOSPITAL_COMMUNITY): Payer: Self-pay

## 2021-12-22 ENCOUNTER — Other Ambulatory Visit: Payer: Self-pay | Admitting: Internal Medicine

## 2021-12-22 ENCOUNTER — Other Ambulatory Visit (HOSPITAL_COMMUNITY): Payer: Self-pay

## 2021-12-24 ENCOUNTER — Other Ambulatory Visit: Payer: Self-pay | Admitting: Internal Medicine

## 2021-12-24 ENCOUNTER — Other Ambulatory Visit (HOSPITAL_COMMUNITY): Payer: Self-pay

## 2021-12-25 ENCOUNTER — Other Ambulatory Visit (HOSPITAL_COMMUNITY): Payer: Self-pay

## 2021-12-25 ENCOUNTER — Other Ambulatory Visit: Payer: Self-pay

## 2021-12-25 MED ORDER — HYDROCHLOROTHIAZIDE 25 MG PO TABS
25.0000 mg | ORAL_TABLET | Freq: Every day | ORAL | 1 refills | Status: DC
  Filled 2021-12-25: qty 30, 30d supply, fill #0
  Filled 2022-01-29: qty 30, 30d supply, fill #1

## 2021-12-25 MED ORDER — LOSARTAN POTASSIUM 50 MG PO TABS
50.0000 mg | ORAL_TABLET | Freq: Every day | ORAL | 1 refills | Status: DC
  Filled 2021-12-25: qty 30, 30d supply, fill #0
  Filled 2022-01-29: qty 30, 30d supply, fill #1

## 2021-12-25 MED ORDER — AMLODIPINE BESYLATE 10 MG PO TABS
10.0000 mg | ORAL_TABLET | Freq: Every day | ORAL | 1 refills | Status: DC
  Filled 2021-12-25: qty 30, 30d supply, fill #0
  Filled 2022-01-29: qty 30, 30d supply, fill #1

## 2021-12-29 ENCOUNTER — Other Ambulatory Visit (HOSPITAL_COMMUNITY): Payer: Self-pay

## 2022-01-29 ENCOUNTER — Other Ambulatory Visit (HOSPITAL_COMMUNITY): Payer: Self-pay

## 2022-02-19 ENCOUNTER — Ambulatory Visit: Payer: 59 | Admitting: Internal Medicine

## 2022-02-23 ENCOUNTER — Other Ambulatory Visit: Payer: Self-pay | Admitting: Internal Medicine

## 2022-02-23 ENCOUNTER — Other Ambulatory Visit (HOSPITAL_COMMUNITY): Payer: Self-pay

## 2022-02-26 ENCOUNTER — Other Ambulatory Visit (HOSPITAL_COMMUNITY): Payer: Self-pay

## 2022-03-08 ENCOUNTER — Other Ambulatory Visit (HOSPITAL_COMMUNITY): Payer: Self-pay

## 2022-03-08 ENCOUNTER — Telehealth: Payer: Self-pay | Admitting: Psychology

## 2022-03-08 MED ORDER — LOSARTAN POTASSIUM 50 MG PO TABS
50.0000 mg | ORAL_TABLET | Freq: Every day | ORAL | 0 refills | Status: DC
  Filled 2022-03-08: qty 30, 30d supply, fill #0

## 2022-03-08 MED ORDER — HYDROCHLOROTHIAZIDE 25 MG PO TABS
25.0000 mg | ORAL_TABLET | Freq: Every day | ORAL | 0 refills | Status: DC
  Filled 2022-03-08: qty 30, 30d supply, fill #0

## 2022-03-08 MED ORDER — AMLODIPINE BESYLATE 10 MG PO TABS
10.0000 mg | ORAL_TABLET | Freq: Every day | ORAL | 0 refills | Status: DC
  Filled 2022-03-08: qty 30, 30d supply, fill #0

## 2022-03-24 ENCOUNTER — Ambulatory Visit (INDEPENDENT_AMBULATORY_CARE_PROVIDER_SITE_OTHER): Payer: 59 | Admitting: Internal Medicine

## 2022-03-24 ENCOUNTER — Other Ambulatory Visit (HOSPITAL_COMMUNITY): Payer: Self-pay

## 2022-03-24 ENCOUNTER — Encounter: Payer: Self-pay | Admitting: Internal Medicine

## 2022-03-24 VITALS — BP 138/82 | HR 68 | Resp 16 | Ht 62.0 in | Wt 193.0 lb

## 2022-03-24 DIAGNOSIS — Z23 Encounter for immunization: Secondary | ICD-10-CM | POA: Diagnosis not present

## 2022-03-24 DIAGNOSIS — Z13820 Encounter for screening for osteoporosis: Secondary | ICD-10-CM

## 2022-03-24 DIAGNOSIS — Z79899 Other long term (current) drug therapy: Secondary | ICD-10-CM

## 2022-03-24 DIAGNOSIS — Z13220 Encounter for screening for lipoid disorders: Secondary | ICD-10-CM

## 2022-03-24 DIAGNOSIS — Z1231 Encounter for screening mammogram for malignant neoplasm of breast: Secondary | ICD-10-CM | POA: Diagnosis not present

## 2022-03-24 DIAGNOSIS — E669 Obesity, unspecified: Secondary | ICD-10-CM | POA: Diagnosis not present

## 2022-03-24 DIAGNOSIS — Z780 Asymptomatic menopausal state: Secondary | ICD-10-CM | POA: Diagnosis not present

## 2022-03-24 MED ORDER — LOSARTAN POTASSIUM 50 MG PO TABS
50.0000 mg | ORAL_TABLET | Freq: Every day | ORAL | 3 refills | Status: DC
  Filled 2022-03-24 – 2022-04-01 (×3): qty 90, 90d supply, fill #0
  Filled 2022-07-05: qty 90, 90d supply, fill #1
  Filled 2022-10-12: qty 90, 90d supply, fill #2
  Filled 2023-01-09: qty 90, 90d supply, fill #3

## 2022-03-24 MED ORDER — AMLODIPINE BESYLATE 10 MG PO TABS
10.0000 mg | ORAL_TABLET | Freq: Every day | ORAL | 3 refills | Status: DC
  Filled 2022-03-24 – 2022-04-01 (×3): qty 90, 90d supply, fill #0
  Filled 2022-07-05: qty 90, 90d supply, fill #1
  Filled 2022-10-12: qty 90, 90d supply, fill #2
  Filled 2023-01-09: qty 90, 90d supply, fill #3

## 2022-03-24 MED ORDER — HYDROCHLOROTHIAZIDE 25 MG PO TABS
25.0000 mg | ORAL_TABLET | Freq: Every day | ORAL | 3 refills | Status: DC
  Filled 2022-03-24 – 2022-04-01 (×3): qty 90, 90d supply, fill #0
  Filled 2022-07-05: qty 90, 90d supply, fill #1
  Filled 2022-10-12: qty 90, 90d supply, fill #2
  Filled 2023-01-09: qty 90, 90d supply, fill #3

## 2022-03-24 NOTE — Progress Notes (Signed)
    Subjective:    Patient ID: Cheryl Browning, female   DOB: Jan 02, 1966, 57 y.o.   MRN: [id number]   HPI  Here after more than 1 year.   Hypertension:  She now goes to the gym on her floor at work [employer]) and walks for 30 minutes every day.    2.  Obesity:  Asked about wegovy and related meds for weight loss  3.  HM:  states she was to have mammogram and bone density in April of 2023, but never heard anything.  Last period was 17 years ago.  IUD since then and last one not removed until 11/2020.  No period since removal as well.  She is having hot flashes and night sweats.  States she obtained influenza vaccine Oct. 2 2023, but not documented in this chart.  Has not had newest COVID vaccine.   Need Td and 2nd shingrix.   Current Meds  Medication Sig   amLODipine (NORVASC) 10 MG tablet Take 1 tablet (10 mg total) by mouth daily.   cholecalciferol (VITAMIN D3) 25 MCG (1000 UNIT) tablet Take 1,000 Units by mouth daily.   hydrochlorothiazide (HYDRODIURIL) 25 MG tablet Take 1 tablet (25 mg total) by mouth daily.   losartan (COZAAR) 50 MG tablet Take 1 tablet (50 mg total) by mouth daily.   Multiple Vitamins-Minerals (MULTIVITAMIN WOMEN 50+ PO) Take by mouth daily.   Current Facility-Administered Medications for the 03/24/22 encounter (Office Visit) with Mack Hook, MD  Medication   0.9 %  sodium chloride infusion   Allergies  Allergen Reactions   Lisinopril Cough    ACE I cough     Review of Systems    Objective:   BP 138/82 (BP Location: Left Arm, Patient Position: Sitting, Cuff Size: Normal)   Pulse 68   Resp 16   Ht 5\' 2"  (1.575 m)   Wt 193 lb (87.5 kg)   BMI 35.30 kg/m   Physical Exam NAD HEENT:  PERRL, EOMI Neck:  Supple, No adenopathy, no thyromegaly Chest:  CTA CV:  RRR with normal S1 and S2, No S3, S4 or murmur.  No carotid bruits.  Carotid, radial and DP pulses normal and equal.   Abd:  S, NT, No HSM or mass, + BS LE:  No  edema.   Assessment & Plan   Hypertension:  fair control  Encouraged working on diet and physical activity.  Discussed diet and eating habits at length.  CBC, CmP, screen for hypercholesterolemia and other cardiac risk factors.    2.  Obesity:  as above.  She is interested in [facility] or similar med for weight loss.  Looked at her coverage and appears uncovered or requires prior auth, generally requiring dx of DM.  Discussed would like for her to work on lifestyle changes first as there are possible side effects.  Checking glucose today.  3.  HM:  order mammogram and DEXA together.  She is concerned about charges--encouraged her to ask when setting up appt.  Shingrix #2/2 Td today Encouraged her to obtain updated COVID vaccine.   She states she obtained influenza, but not in [facility] Chart.  4.  Menopause:  DEXA  Return in 6 months for CPE no pap

## 2022-03-24 NOTE — Patient Instructions (Signed)

## 2022-03-25 ENCOUNTER — Other Ambulatory Visit: Payer: 59

## 2022-03-25 DIAGNOSIS — Z13220 Encounter for screening for lipoid disorders: Secondary | ICD-10-CM | POA: Diagnosis not present

## 2022-03-25 DIAGNOSIS — Z79899 Other long term (current) drug therapy: Secondary | ICD-10-CM | POA: Diagnosis not present

## 2022-03-26 LAB — LIPID PANEL W/O CHOL/HDL RATIO
Cholesterol, Total: 144 mg/dL (ref 100–199)
HDL: 68 mg/dL (ref >39)
LDL Chol Calc (NIH): 58 mg/dL (ref 0–99)
Triglycerides: 98 mg/dL (ref 0–149)
VLDL Cholesterol Cal: 18 mg/dL (ref 5–40)

## 2022-03-26 LAB — CBC WITH DIFFERENTIAL/PLATELET
Basophils Absolute: 0 10*3/uL (ref 0.0–0.2)
Basos: 0 %
EOS (ABSOLUTE): 0 10*3/uL (ref 0.0–0.4)
Eos: 0 %
Hematocrit: 44.9 % (ref 34.0–46.6)
Hemoglobin: 15 g/dL (ref 11.1–15.9)
Immature Grans (Abs): 0 10*3/uL (ref 0.0–0.1)
Immature Granulocytes: 0 %
Lymphocytes Absolute: 2 10*3/uL (ref 0.7–3.1)
Lymphs: 24 %
MCH: 29.1 pg (ref 26.6–33.0)
MCHC: 33.4 g/dL (ref 31.5–35.7)
MCV: 87 fL (ref 79–97)
Monocytes Absolute: 0.5 10*3/uL (ref 0.1–0.9)
Monocytes: 6 %
Neutrophils Absolute: 5.9 10*3/uL (ref 1.4–7.0)
Neutrophils: 70 %
Platelets: 164 10*3/uL (ref 150–450)
RBC: 5.15 x10E6/uL (ref 3.77–5.28)
RDW: 14.9 % (ref 11.7–15.4)
WBC: 8.6 10*3/uL (ref 3.4–10.8)

## 2022-03-26 LAB — COMPREHENSIVE METABOLIC PANEL
ALT: 20 IU/L (ref 0–32)
AST: 18 IU/L (ref 0–40)
Albumin/Globulin Ratio: 1.3 (ref 1.2–2.2)
Albumin: 4.3 g/dL (ref 3.8–4.9)
Alkaline Phosphatase: 102 IU/L (ref 44–121)
BUN/Creatinine Ratio: 15 (ref 9–23)
BUN: 13 mg/dL (ref 6–24)
Bilirubin Total: 0.3 mg/dL (ref 0.0–1.2)
CO2: 26 mmol/L (ref 20–29)
Calcium: 9.7 mg/dL (ref 8.7–10.2)
Chloride: 102 mmol/L (ref 96–106)
Creatinine, Ser: 0.84 mg/dL (ref 0.57–1.00)
Globulin, Total: 3.4 g/dL (ref 1.5–4.5)
Glucose: 88 mg/dL (ref 70–99)
Potassium: 4.3 mmol/L (ref 3.5–5.2)
Sodium: 142 mmol/L (ref 134–144)
Total Protein: 7.7 g/dL (ref 6.0–8.5)
eGFR: 83 mL/min/{1.73_m2} (ref >59)

## 2022-03-31 ENCOUNTER — Other Ambulatory Visit (HOSPITAL_COMMUNITY): Payer: Self-pay

## 2022-04-01 ENCOUNTER — Other Ambulatory Visit: Payer: Self-pay

## 2022-05-12 ENCOUNTER — Ambulatory Visit: Payer: Commercial Managed Care - PPO

## 2022-05-19 ENCOUNTER — Ambulatory Visit: Payer: Self-pay

## 2022-06-04 ENCOUNTER — Ambulatory Visit: Payer: Self-pay

## 2022-07-19 ENCOUNTER — Ambulatory Visit: Payer: Self-pay

## 2022-09-29 ENCOUNTER — Inpatient Hospital Stay: Admission: RE | Admit: 2022-09-29 | Payer: Commercial Managed Care - PPO | Source: Ambulatory Visit

## 2022-09-30 ENCOUNTER — Encounter: Payer: 59 | Admitting: Internal Medicine

## 2023-01-11 ENCOUNTER — Other Ambulatory Visit (HOSPITAL_COMMUNITY): Payer: Self-pay

## 2023-08-17 ENCOUNTER — Other Ambulatory Visit: Payer: Self-pay | Admitting: Internal Medicine

## 2023-08-17 ENCOUNTER — Other Ambulatory Visit: Payer: Self-pay

## 2023-08-22 ENCOUNTER — Other Ambulatory Visit: Payer: Self-pay

## 2023-08-22 ENCOUNTER — Other Ambulatory Visit: Payer: Self-pay | Admitting: Internal Medicine

## 2023-08-22 ENCOUNTER — Other Ambulatory Visit (HOSPITAL_COMMUNITY): Payer: Self-pay

## 2023-08-22 MED ORDER — HYDROCHLOROTHIAZIDE 25 MG PO TABS
25.0000 mg | ORAL_TABLET | Freq: Every day | ORAL | 3 refills | Status: AC
  Filled 2023-08-22: qty 90, 90d supply, fill #0
  Filled 2023-11-28: qty 90, 90d supply, fill #1

## 2023-08-22 MED ORDER — AMLODIPINE BESYLATE 10 MG PO TABS
10.0000 mg | ORAL_TABLET | Freq: Every day | ORAL | 3 refills | Status: AC
  Filled 2023-08-22: qty 90, 90d supply, fill #0
  Filled 2023-11-28: qty 90, 90d supply, fill #1

## 2023-08-24 ENCOUNTER — Other Ambulatory Visit (HOSPITAL_COMMUNITY): Payer: Self-pay

## 2023-08-24 ENCOUNTER — Other Ambulatory Visit: Payer: Self-pay | Admitting: Internal Medicine

## 2023-08-24 MED ORDER — LOSARTAN POTASSIUM 50 MG PO TABS
50.0000 mg | ORAL_TABLET | Freq: Every day | ORAL | 1 refills | Status: DC
  Filled 2023-08-24: qty 30, 30d supply, fill #0

## 2023-08-29 ENCOUNTER — Other Ambulatory Visit (HOSPITAL_COMMUNITY): Payer: Self-pay

## 2023-08-30 ENCOUNTER — Other Ambulatory Visit (HOSPITAL_COMMUNITY): Payer: Self-pay

## 2023-10-10 ENCOUNTER — Ambulatory Visit (INDEPENDENT_AMBULATORY_CARE_PROVIDER_SITE_OTHER): Admitting: Internal Medicine

## 2023-10-10 ENCOUNTER — Encounter: Payer: Self-pay | Admitting: Internal Medicine

## 2023-10-10 ENCOUNTER — Other Ambulatory Visit (HOSPITAL_COMMUNITY): Payer: Self-pay

## 2023-10-10 ENCOUNTER — Other Ambulatory Visit: Payer: Self-pay | Admitting: Internal Medicine

## 2023-10-10 VITALS — BP 138/80 | HR 57 | Resp 18 | Ht 62.0 in | Wt 174.0 lb

## 2023-10-10 DIAGNOSIS — H547 Unspecified visual loss: Secondary | ICD-10-CM | POA: Diagnosis not present

## 2023-10-10 DIAGNOSIS — Z13220 Encounter for screening for lipoid disorders: Secondary | ICD-10-CM

## 2023-10-10 DIAGNOSIS — Z1231 Encounter for screening mammogram for malignant neoplasm of breast: Secondary | ICD-10-CM

## 2023-10-10 DIAGNOSIS — H11003 Unspecified pterygium of eye, bilateral: Secondary | ICD-10-CM | POA: Diagnosis not present

## 2023-10-10 DIAGNOSIS — Z124 Encounter for screening for malignant neoplasm of cervix: Secondary | ICD-10-CM | POA: Diagnosis not present

## 2023-10-10 DIAGNOSIS — Z0000 Encounter for general adult medical examination without abnormal findings: Secondary | ICD-10-CM

## 2023-10-10 DIAGNOSIS — Z23 Encounter for immunization: Secondary | ICD-10-CM | POA: Diagnosis not present

## 2023-10-10 MED ORDER — LOSARTAN POTASSIUM 50 MG PO TABS
50.0000 mg | ORAL_TABLET | Freq: Every day | ORAL | 3 refills | Status: AC
  Filled 2023-10-10: qty 90, 90d supply, fill #0
  Filled 2024-01-09: qty 90, 90d supply, fill #1

## 2023-10-10 MED ORDER — ATOVAQUONE-PROGUANIL HCL 250-100 MG PO TABS
1.0000 | ORAL_TABLET | Freq: Every day | ORAL | 0 refills | Status: AC
  Filled 2023-10-10: qty 40, 40d supply, fill #0

## 2023-10-10 NOTE — Progress Notes (Signed)
 Subjective:    Patient ID: Cheryl Browning, female   DOB: May 13, 1965, 58 y.o.   MRN: [id number]   HPI  CPE with pap  1.  Pap:  Last in 2020 and normal  2.  Mammogram:  She has not gone for mammogram since 2010, despite multiple attempts to get her scheduled.  Sounds like she has been called to schedule, but just does not get it done.    3.  Osteoprevention:  2 servings of 2 % milk daily.  Willing to increase to 3 servings daily.  Works out 30 minutes 4 times weekly during break at [employer].    4.  Guaiac Cards/FIT:  Never returned.    5.  Colonoscopy:  One adenomatous polyp in 2022 colonoscopy with Dr. Teressa, who has retired.  She is due again in 2029.    6.  Immunizations:  States had influenza vaccine at [facility] with work.  Believes it was Sept 27th.  Has not had COVID vaccine this month. Needs Pneumococcal 20.   Immunization History  Administered Date(s) Administered   Hep B, Unspecified 04/21/2000, 05/21/2000   Hepatitis B, ADULT 12/08/2011   Influenza, Seasonal, Injecte, Preservative Fre 10/09/2016   Influenza-Unspecified 10/03/2017   MMR 08/13/2009, 10/15/2009   Moderna SARS-COV2 Booster Vaccination 09/05/2020   Moderna Sars-Covid-2 Vaccination 01/15/2019, 02/15/2019   Td 03/24/2022   Td (Adult),5 Lf Tetanus Toxid, Preservative Free 06/19/2012   Td (Adult),unspecified 04/21/2000   Tdap 12/08/2011   Varicella 12/08/2011, 01/20/2012   Zoster Recombinant(Shingrix ) 09/05/2020, 03/24/2022      7.  Glucose/Cholesterol:  glucose and cholesterol fine in past. Lipid Panel     Component Value Date/Time   CHOL 144 03/25/2022 0930   TRIG 98 03/25/2022 0930   HDL 68 03/25/2022 0930   CHOLHDL 2.2 Ratio 01/24/2008 0019   VLDL 9 01/24/2008 0019   LDLCALC 58 03/25/2022 0930   LABVLDL 18 03/25/2022 0930     Current Meds  Medication Sig   amLODipine  (NORVASC ) 10 MG tablet Take 1 tablet (10 mg total) by mouth daily.   cholecalciferol (VITAMIN D3) 25 MCG (1000 UNIT) tablet  Take 1,000 Units by mouth daily.   hydrochlorothiazide  (HYDRODIURIL ) 25 MG tablet Take 1 tablet (25 mg total) by mouth daily.   losartan  (COZAAR ) 50 MG tablet Take 1 tablet (50 mg total) by mouth daily.   Multiple Vitamins-Minerals (MULTIVITAMIN WOMEN 50+ PO) Take by mouth daily.   Allergies  Allergen Reactions   Lisinopril Cough    ACE I cough   Past Medical History:  Diagnosis Date   Hypertension 2010   No past surgical history on file.  Family History  Problem Relation Age of Onset   Hypertension Father    Colon cancer Neg Hx    Colon polyps Neg Hx    Social History   Socioeconomic History   Marital status: Married    Spouse name: Research officer, trade union   Number of children: 5   Years of education: Not on file   Highest education level: Master's degree (e.g., MA, MS, MEng, MEd, MSW, MBA)  Occupational History   Occupation: nurse at  [employer]  Tobacco Use   Smoking status: Never   Smokeless tobacco: Never  Vaping Use   Vaping status: Never Used  Substance and Sexual Activity   Alcohol use: Never   Drug use: Never   Sexual activity: Yes    Birth control/protection: Post-menopausal  Other Topics Concern   Not on file  Social History Narrative  Lives at home with husband, mother in law--[place]   Children are all out of home with jobs or in college/graduate school.   Social Drivers of Corporate investment banker Strain: Low Risk  (10/10/2023)   Overall Financial Resource Strain (CARDIA)    Difficulty of Paying Living Expenses: Not hard at all  Food Insecurity: No Food Insecurity (10/10/2023)   Hunger Vital Sign    Worried About Running Out of Food in the Last Year: Never true    Ran Out of Food in the Last Year: Never true  Transportation Needs: No Transportation Needs (10/10/2023)   PRAPARE - Administrator, Civil Service (Medical): No    Lack of Transportation (Non-Medical): No  Physical Activity: Not  on file  Stress: No Stress Concern Present (10/24/2017)   [employer] of Occupational Health - Occupational Stress Questionnaire    Feeling of Stress : Only a little  Social Connections: Unknown (05/21/2021)   [facility]   Social Network    Social Network: Not on file  Intimate Partner Violence: Not At Risk (10/10/2023)   Humiliation, Afraid, Rape, and Kick questionnaire    Fear of Current or Ex-Partner: No    Emotionally Abused: No    Physically Abused: No    Sexually Abused: No     Review of Systems  Constitutional:        Traveling to [place] in 2 weeks.  Needs malaria prophylaxis.  She will be there for 1 month.    HENT:  Negative for dental problem (Dental office on [place]).   Eyes:  Negative for visual disturbance (Does use reading glasses.).  Respiratory:  Negative for shortness of breath.   Cardiovascular:  Negative for chest pain, palpitations and leg swelling.  Gastrointestinal:  Negative for abdominal pain and blood in stool (No melena).  Endocrine: Positive for heat intolerance.  Genitourinary:  Negative for vaginal bleeding.  Neurological:  Negative for weakness and numbness.  Psychiatric/Behavioral:  Negative for dysphoric mood. The patient is not nervous/anxious.       Objective:   BP 138/80 (BP Location: Left Arm, Patient Position: Sitting, Cuff Size: Normal)   Pulse (!) 57   Resp 18   Ht 5' 2 (1.575 m)   Wt 174 lb (78.9 kg)   BMI 31.83 kg/m   Physical Exam Constitutional:      Appearance: She is obese.  HENT:     Head: Normocephalic and atraumatic.     Right Ear: Tympanic membrane, ear canal and external ear normal.     Left Ear: Tympanic membrane, ear canal and external ear normal.     Nose: Nose normal.     Mouth/Throat:     Mouth: Mucous membranes are moist.     Pharynx: Oropharynx is clear.  Eyes:     Extraocular Movements: Extraocular movements intact.     Conjunctiva/sclera: Conjunctivae normal.     Pupils: Pupils  are equal, round, and reactive to light.     Comments: Pupils small, unable to see discs well Bilateral nasal pterygium involving edge of iris.  Neck:     Thyroid: No thyroid mass or thyromegaly.  Cardiovascular:     Rate and Rhythm: Normal rate and regular rhythm.     Heart sounds: S1 normal and S2 normal.     No S3 or S4 sounds.     Comments: No carotid bruits.  Carotid, radial, femoral,  DP and PT pulses normal and equal.   Pulmonary:     Effort: Pulmonary effort is normal.     Breath sounds: Normal breath sounds and air entry.  Chest:  Breasts:    Right: No inverted nipple, mass or nipple discharge.     Left: No inverted nipple, mass or nipple discharge.  Abdominal:     General: Bowel sounds are normal.     Palpations: Abdomen is soft. There is no hepatomegaly, splenomegaly or mass.     Tenderness: There is no abdominal tenderness.     Hernia: No hernia is present.  Genitourinary:    Comments: Normal external female genitalia No cervical lesion, patulous os No uterine or adnexal mass or tenderness. Musculoskeletal:        General: Normal range of motion.     Cervical back: Normal range of motion and neck supple.     Right lower leg: No edema.     Left lower leg: No edema.  Lymphadenopathy:     Head:     Right side of head: No submental or submandibular adenopathy.     Left side of head: No submental or submandibular adenopathy.     Cervical: No cervical adenopathy.     Upper Body:     Right upper body: No supraclavicular or axillary adenopathy.     Left upper body: No supraclavicular or axillary adenopathy.     Lower Body: No right inguinal adenopathy. No left inguinal adenopathy.  Skin:    General: Skin is warm.     Capillary Refill: Capillary refill takes less than 2 seconds.  Neurological:     General: No focal deficit present.     Mental Status: She is alert and oriented to person, place, and time.     Cranial Nerves: Cranial nerves 2-12 are intact.     Sensory:  Sensation is intact.     Motor: Motor function is intact.     Coordination: Coordination is intact.     Gait: Gait is intact.     Deep Tendon Reflexes: Reflexes are normal and symmetric.  Psychiatric:        Mood and Affect: Mood normal.        Speech: Speech normal.        Behavior: Behavior normal. Behavior is cooperative.        Thought Content: Thought content normal.      Assessment & Plan   CPE with pap Ordered mammogram with instructions on how to best get hold of her She is to set up voicemail Spikevax and Pneumococcal 20 today Fasting labs tomorrow and bring in FIT.  2.  Hypertension:  controlled  4.  Bilateral pterygium:  need for eye exam and presbyopia sx.  Referral to Dr. Cleatus.    5.  Malaria prophylaxis for travel to [place]:  Malarone 250-100 mg once daily 2 days prior to travel, during stay and for 7 days on return.

## 2023-10-11 ENCOUNTER — Other Ambulatory Visit (INDEPENDENT_AMBULATORY_CARE_PROVIDER_SITE_OTHER)

## 2023-10-11 DIAGNOSIS — I10 Essential (primary) hypertension: Secondary | ICD-10-CM

## 2023-10-11 DIAGNOSIS — Z13220 Encounter for screening for lipoid disorders: Secondary | ICD-10-CM | POA: Diagnosis not present

## 2023-10-12 ENCOUNTER — Ambulatory Visit: Payer: Self-pay | Admitting: Internal Medicine

## 2023-10-12 LAB — LIPID PANEL
Chol/HDL Ratio: 1.8 ratio (ref 0.0–4.4)
Cholesterol, Total: 151 mg/dL (ref 100–199)
HDL: 82 mg/dL (ref >39)
LDL Chol Calc (NIH): 57 mg/dL (ref 0–99)
Triglycerides: 59 mg/dL (ref 0–149)
VLDL Cholesterol Cal: 12 mg/dL (ref 5–40)

## 2023-10-12 LAB — COMPREHENSIVE METABOLIC PANEL WITH GFR
ALT: 19 IU/L (ref 0–32)
AST: 16 IU/L (ref 0–40)
Albumin: 4.2 g/dL (ref 3.8–4.9)
Alkaline Phosphatase: 81 IU/L (ref 49–135)
BUN/Creatinine Ratio: 13 (ref 9–23)
BUN: 9 mg/dL (ref 6–24)
Bilirubin Total: 0.5 mg/dL (ref 0.0–1.2)
CO2: 23 mmol/L (ref 20–29)
Calcium: 9.4 mg/dL (ref 8.7–10.2)
Chloride: 98 mmol/L (ref 96–106)
Creatinine, Ser: 0.67 mg/dL (ref 0.57–1.00)
Globulin, Total: 3.2 g/dL (ref 1.5–4.5)
Glucose: 91 mg/dL (ref 70–99)
Potassium: 3.7 mmol/L (ref 3.5–5.2)
Sodium: 139 mmol/L (ref 134–144)
Total Protein: 7.4 g/dL (ref 6.0–8.5)
eGFR: 103 mL/min/1.73 (ref >59)

## 2023-10-12 LAB — CBC WITH DIFFERENTIAL/PLATELET
Basophils Absolute: 0 x10E3/uL (ref 0.0–0.2)
Basos: 1 %
EOS (ABSOLUTE): 0 x10E3/uL (ref 0.0–0.4)
Eos: 0 %
Hematocrit: 40.8 % (ref 34.0–46.6)
Hemoglobin: 13.4 g/dL (ref 11.1–15.9)
Immature Grans (Abs): 0 x10E3/uL (ref 0.0–0.1)
Immature Granulocytes: 0 %
Lymphocytes Absolute: 1.2 x10E3/uL (ref 0.7–3.1)
Lymphs: 18 %
MCH: 28.9 pg (ref 26.6–33.0)
MCHC: 32.8 g/dL (ref 31.5–35.7)
MCV: 88 fL (ref 79–97)
Monocytes Absolute: 0.5 x10E3/uL (ref 0.1–0.9)
Monocytes: 7 %
Neutrophils Absolute: 4.9 x10E3/uL (ref 1.4–7.0)
Neutrophils: 74 %
Platelets: 137 x10E3/uL — ABNORMAL LOW (ref 150–450)
RBC: 4.64 x10E6/uL (ref 3.77–5.28)
RDW: 14.8 % (ref 11.7–15.4)
WBC: 6.7 x10E3/uL (ref 3.4–10.8)

## 2023-10-12 LAB — CYTOLOGY - PAP

## 2023-11-09 ENCOUNTER — Ambulatory Visit: Payer: Self-pay | Admitting: Internal Medicine

## 2023-11-09 NOTE — Progress Notes (Signed)
 Did not answer , left them a voice massage to call us  back.

## 2023-11-14 NOTE — Progress Notes (Signed)
 Called her again today and she  did not answer. I left her  a voice massage to call back

## 2023-12-12 ENCOUNTER — Other Ambulatory Visit (INDEPENDENT_AMBULATORY_CARE_PROVIDER_SITE_OTHER)

## 2023-12-12 DIAGNOSIS — D696 Thrombocytopenia, unspecified: Secondary | ICD-10-CM

## 2023-12-13 LAB — CBC WITH DIFFERENTIAL/PLATELET
Basophils Absolute: 0 x10E3/uL (ref 0.0–0.2)
Basos: 1 %
EOS (ABSOLUTE): 0.1 x10E3/uL (ref 0.0–0.4)
Eos: 1 %
Hematocrit: 41.3 % (ref 34.0–46.6)
Hemoglobin: 13.6 g/dL (ref 11.1–15.9)
Immature Grans (Abs): 0 x10E3/uL (ref 0.0–0.1)
Immature Granulocytes: 0 %
Lymphocytes Absolute: 2.7 x10E3/uL (ref 0.7–3.1)
Lymphs: 58 %
MCH: 29.1 pg (ref 26.6–33.0)
MCHC: 32.9 g/dL (ref 31.5–35.7)
MCV: 88 fL (ref 79–97)
Monocytes Absolute: 0.3 x10E3/uL (ref 0.1–0.9)
Monocytes: 6 %
Neutrophils Absolute: 1.6 x10E3/uL (ref 1.4–7.0)
Neutrophils: 34 %
Platelets: 181 x10E3/uL (ref 150–450)
RBC: 4.67 x10E6/uL (ref 3.77–5.28)
RDW: 14.5 % (ref 11.7–15.4)
WBC: 4.7 x10E3/uL (ref 3.4–10.8)

## 2024-10-09 ENCOUNTER — Other Ambulatory Visit: Admitting: Internal Medicine

## 2024-10-11 ENCOUNTER — Encounter: Admitting: Internal Medicine
